# Patient Record
Sex: Male | Born: 1950 | Race: White | Hispanic: No | Marital: Married | Smoking: Never smoker
Health system: Southern US, Community
[De-identification: ages and names within clinical notes are randomized; demographics above are authoritative.]

## PROBLEM LIST (undated history)

## (undated) DIAGNOSIS — J45909 Unspecified asthma, uncomplicated: Secondary | ICD-10-CM

## (undated) DIAGNOSIS — G629 Polyneuropathy, unspecified: Secondary | ICD-10-CM

## (undated) DIAGNOSIS — K635 Polyp of colon: Secondary | ICD-10-CM

## (undated) HISTORY — PX: TONSILLECTOMY AND ADENOIDECTOMY: SUR1326

## (undated) HISTORY — DX: Unspecified asthma, uncomplicated: J45.909

## (undated) HISTORY — DX: Polyneuropathy, unspecified: G62.9

## (undated) HISTORY — DX: Polyp of colon: K63.5

---

## 2003-04-01 HISTORY — PX: OTHER SURGICAL HISTORY: SHX169

## 2003-10-21 ENCOUNTER — Emergency Department (HOSPITAL_COMMUNITY): Admission: EM | Admit: 2003-10-21 | Discharge: 2003-10-21 | Payer: Self-pay | Admitting: Emergency Medicine

## 2004-03-31 HISTORY — PX: OTHER SURGICAL HISTORY: SHX169

## 2006-03-31 DIAGNOSIS — K635 Polyp of colon: Secondary | ICD-10-CM

## 2006-03-31 HISTORY — DX: Polyp of colon: K63.5

## 2006-03-31 HISTORY — PX: OTHER SURGICAL HISTORY: SHX169

## 2006-04-14 ENCOUNTER — Ambulatory Visit: Payer: Self-pay | Admitting: Internal Medicine

## 2006-04-14 LAB — CONVERTED CEMR LAB
ALT: 26 units/L (ref 0–40)
AST: 27 units/L (ref 0–37)
Albumin: 4.4 g/dL (ref 3.5–5.2)
Alkaline Phosphatase: 60 units/L (ref 39–117)
BUN: 13 mg/dL (ref 6–23)
Basophils Absolute: 0 10*3/uL (ref 0.0–0.1)
Basophils Relative: 0.7 % (ref 0.0–1.0)
CO2: 29 meq/L (ref 19–32)
Calcium: 9.6 mg/dL (ref 8.4–10.5)
Chloride: 103 meq/L (ref 96–112)
Cholesterol: 225 mg/dL (ref 0–200)
Creatinine, Ser: 1.1 mg/dL (ref 0.4–1.5)
Direct LDL: 154.9 mg/dL
Eosinophils Relative: 1.4 % (ref 0.0–5.0)
Free T4: 0.8 ng/dL (ref 0.6–1.6)
GFR calc Af Amer: 89 mL/min
GFR calc non Af Amer: 74 mL/min
Glucose, Bld: 92 mg/dL (ref 70–99)
HCT: 45.1 % (ref 39.0–52.0)
HDL: 53.6 mg/dL (ref >39.0)
Hemoglobin: 15.2 g/dL (ref 13.0–17.0)
Lymphocytes Relative: 18 % (ref 12.0–46.0)
MCHC: 33.6 g/dL (ref 30.0–36.0)
MCV: 88.5 fL (ref 78.0–100.0)
Monocytes Absolute: 0.6 10*3/uL (ref 0.2–0.7)
Monocytes Relative: 9.2 % (ref 3.0–11.0)
Neutro Abs: 5 10*3/uL (ref 1.4–7.7)
Neutrophils Relative %: 70.7 % (ref 43.0–77.0)
PSA: 0.53 ng/mL (ref 0.10–4.00)
Platelets: 288 10*3/uL (ref 150–400)
Potassium: 4.7 meq/L (ref 3.5–5.1)
RBC: 5.09 M/uL (ref 4.22–5.81)
RDW: 12.7 % (ref 11.5–14.6)
Sodium: 140 meq/L (ref 135–145)
T3, Free: 2.9 pg/mL (ref 2.3–4.2)
TSH: 4.02 microintl units/mL (ref 0.35–5.50)
Total Bilirubin: 1 mg/dL (ref 0.3–1.2)
Total CHOL/HDL Ratio: 4.2
Total Protein: 6.6 g/dL (ref 6.0–8.3)
Triglycerides: 67 mg/dL (ref 0–149)
VLDL: 13 mg/dL (ref 0–40)
WBC: 7 10*3/uL (ref 4.5–10.5)

## 2006-04-27 ENCOUNTER — Ambulatory Visit: Payer: Self-pay

## 2006-05-02 DIAGNOSIS — D126 Benign neoplasm of colon, unspecified: Secondary | ICD-10-CM | POA: Insufficient documentation

## 2006-05-04 ENCOUNTER — Ambulatory Visit: Payer: Self-pay | Admitting: Gastroenterology

## 2006-05-18 ENCOUNTER — Ambulatory Visit: Payer: Self-pay | Admitting: Gastroenterology

## 2006-05-18 ENCOUNTER — Encounter (INDEPENDENT_AMBULATORY_CARE_PROVIDER_SITE_OTHER): Payer: Self-pay | Admitting: Specialist

## 2006-08-25 ENCOUNTER — Ambulatory Visit: Payer: Self-pay | Admitting: Gastroenterology

## 2006-08-25 LAB — CONVERTED CEMR LAB
ALT: 20 units/L (ref 0–40)
AST: 23 units/L (ref 0–37)
Albumin: 4.2 g/dL (ref 3.5–5.2)
Alkaline Phosphatase: 53 units/L (ref 39–117)
BUN: 14 mg/dL (ref 6–23)
Bilirubin, Direct: 0.1 mg/dL (ref 0.0–0.3)
CO2: 32 meq/L (ref 19–32)
Calcium: 9.4 mg/dL (ref 8.4–10.5)
Chloride: 106 meq/L (ref 96–112)
Creatinine, Ser: 0.9 mg/dL (ref 0.4–1.5)
GFR calc Af Amer: 113 mL/min
GFR calc non Af Amer: 93 mL/min
Glucose, Bld: 81 mg/dL (ref 70–99)
HCT: 45.3 % (ref 39.0–52.0)
Potassium: 4.4 meq/L (ref 3.5–5.1)
Sodium: 142 meq/L (ref 135–145)
TSH: 4.3 microintl units/mL (ref 0.35–5.50)
Tissue Transglutaminase Ab, IgA: <3 units (ref <5)
Total Bilirubin: 0.8 mg/dL (ref 0.3–1.2)
Total Protein: 6.6 g/dL (ref 6.0–8.3)

## 2006-08-26 ENCOUNTER — Encounter: Payer: Self-pay | Admitting: Gastroenterology

## 2006-12-29 ENCOUNTER — Ambulatory Visit: Payer: Self-pay | Admitting: Internal Medicine

## 2007-04-16 ENCOUNTER — Ambulatory Visit: Payer: Self-pay | Admitting: Internal Medicine

## 2007-06-11 ENCOUNTER — Telehealth: Payer: Self-pay | Admitting: Internal Medicine

## 2007-06-12 ENCOUNTER — Ambulatory Visit: Payer: Self-pay | Admitting: Family Medicine

## 2007-06-14 ENCOUNTER — Telehealth (INDEPENDENT_AMBULATORY_CARE_PROVIDER_SITE_OTHER): Payer: Self-pay | Admitting: *Deleted

## 2007-08-31 ENCOUNTER — Ambulatory Visit: Payer: Self-pay | Admitting: Internal Medicine

## 2007-08-31 DIAGNOSIS — E785 Hyperlipidemia, unspecified: Secondary | ICD-10-CM | POA: Insufficient documentation

## 2007-08-31 DIAGNOSIS — J45909 Unspecified asthma, uncomplicated: Secondary | ICD-10-CM | POA: Insufficient documentation

## 2007-08-31 DIAGNOSIS — G609 Hereditary and idiopathic neuropathy, unspecified: Secondary | ICD-10-CM | POA: Insufficient documentation

## 2007-09-02 ENCOUNTER — Encounter (INDEPENDENT_AMBULATORY_CARE_PROVIDER_SITE_OTHER): Payer: Self-pay | Admitting: *Deleted

## 2008-07-18 ENCOUNTER — Ambulatory Visit: Payer: Self-pay | Admitting: Internal Medicine

## 2008-07-18 ENCOUNTER — Encounter: Payer: Self-pay | Admitting: Internal Medicine

## 2008-07-18 DIAGNOSIS — R209 Unspecified disturbances of skin sensation: Secondary | ICD-10-CM | POA: Insufficient documentation

## 2008-07-18 LAB — CONVERTED CEMR LAB
Cholesterol, target level: 200 mg/dL
Cholesterol: 235 mg/dL — ABNORMAL HIGH (ref 0–200)
Direct LDL: 155.2 mg/dL
Folate: >20 ng/mL
Free T4: 0.8 ng/dL (ref 0.6–1.6)
HDL goal, serum: 40 mg/dL
HDL: 56.2 mg/dL (ref >39.00)
LDL Goal: 120 mg/dL
TSH: 3.25 microintl units/mL (ref 0.35–5.50)
Total CHOL/HDL Ratio: 4
Triglycerides: 62 mg/dL (ref 0.0–149.0)
VLDL: 12.4 mg/dL (ref 0.0–40.0)
Vitamin B-12: 499 pg/mL (ref 211–911)

## 2008-07-20 ENCOUNTER — Telehealth (INDEPENDENT_AMBULATORY_CARE_PROVIDER_SITE_OTHER): Payer: Self-pay | Admitting: *Deleted

## 2008-07-20 ENCOUNTER — Encounter (INDEPENDENT_AMBULATORY_CARE_PROVIDER_SITE_OTHER): Payer: Self-pay | Admitting: *Deleted

## 2008-09-07 ENCOUNTER — Ambulatory Visit: Payer: Self-pay | Admitting: Internal Medicine

## 2008-09-10 LAB — CONVERTED CEMR LAB
ALT: 22 units/L (ref 0–53)
AST: 28 units/L (ref 0–37)
Albumin: 4.1 g/dL (ref 3.5–5.2)
Alkaline Phosphatase: 60 units/L (ref 39–117)
Bilirubin, Direct: 0 mg/dL (ref 0.0–0.3)
Cholesterol: 216 mg/dL — ABNORMAL HIGH (ref 0–200)
Direct LDL: 154.8 mg/dL
HDL: 56.2 mg/dL (ref >39.00)
Total Bilirubin: 0.8 mg/dL (ref 0.3–1.2)
Total CHOL/HDL Ratio: 4
Total Protein: 6.8 g/dL (ref 6.0–8.3)
Triglycerides: 50 mg/dL (ref 0.0–149.0)
VLDL: 10 mg/dL (ref 0.0–40.0)

## 2008-09-11 ENCOUNTER — Encounter (INDEPENDENT_AMBULATORY_CARE_PROVIDER_SITE_OTHER): Payer: Self-pay | Admitting: *Deleted

## 2008-09-11 ENCOUNTER — Telehealth (INDEPENDENT_AMBULATORY_CARE_PROVIDER_SITE_OTHER): Payer: Self-pay | Admitting: *Deleted

## 2008-11-17 ENCOUNTER — Telehealth (INDEPENDENT_AMBULATORY_CARE_PROVIDER_SITE_OTHER): Payer: Self-pay | Admitting: *Deleted

## 2009-05-25 ENCOUNTER — Ambulatory Visit: Payer: Self-pay | Admitting: Family

## 2009-12-28 IMAGING — CR DG CHEST 2V
2 series · 2 of 2 positions shown · non-contrast
Comparison: None available.

CLINICAL DATA: Asthma.  Cough.

CHEST - 2 VIEW

[view not recorded (1 of 2)]
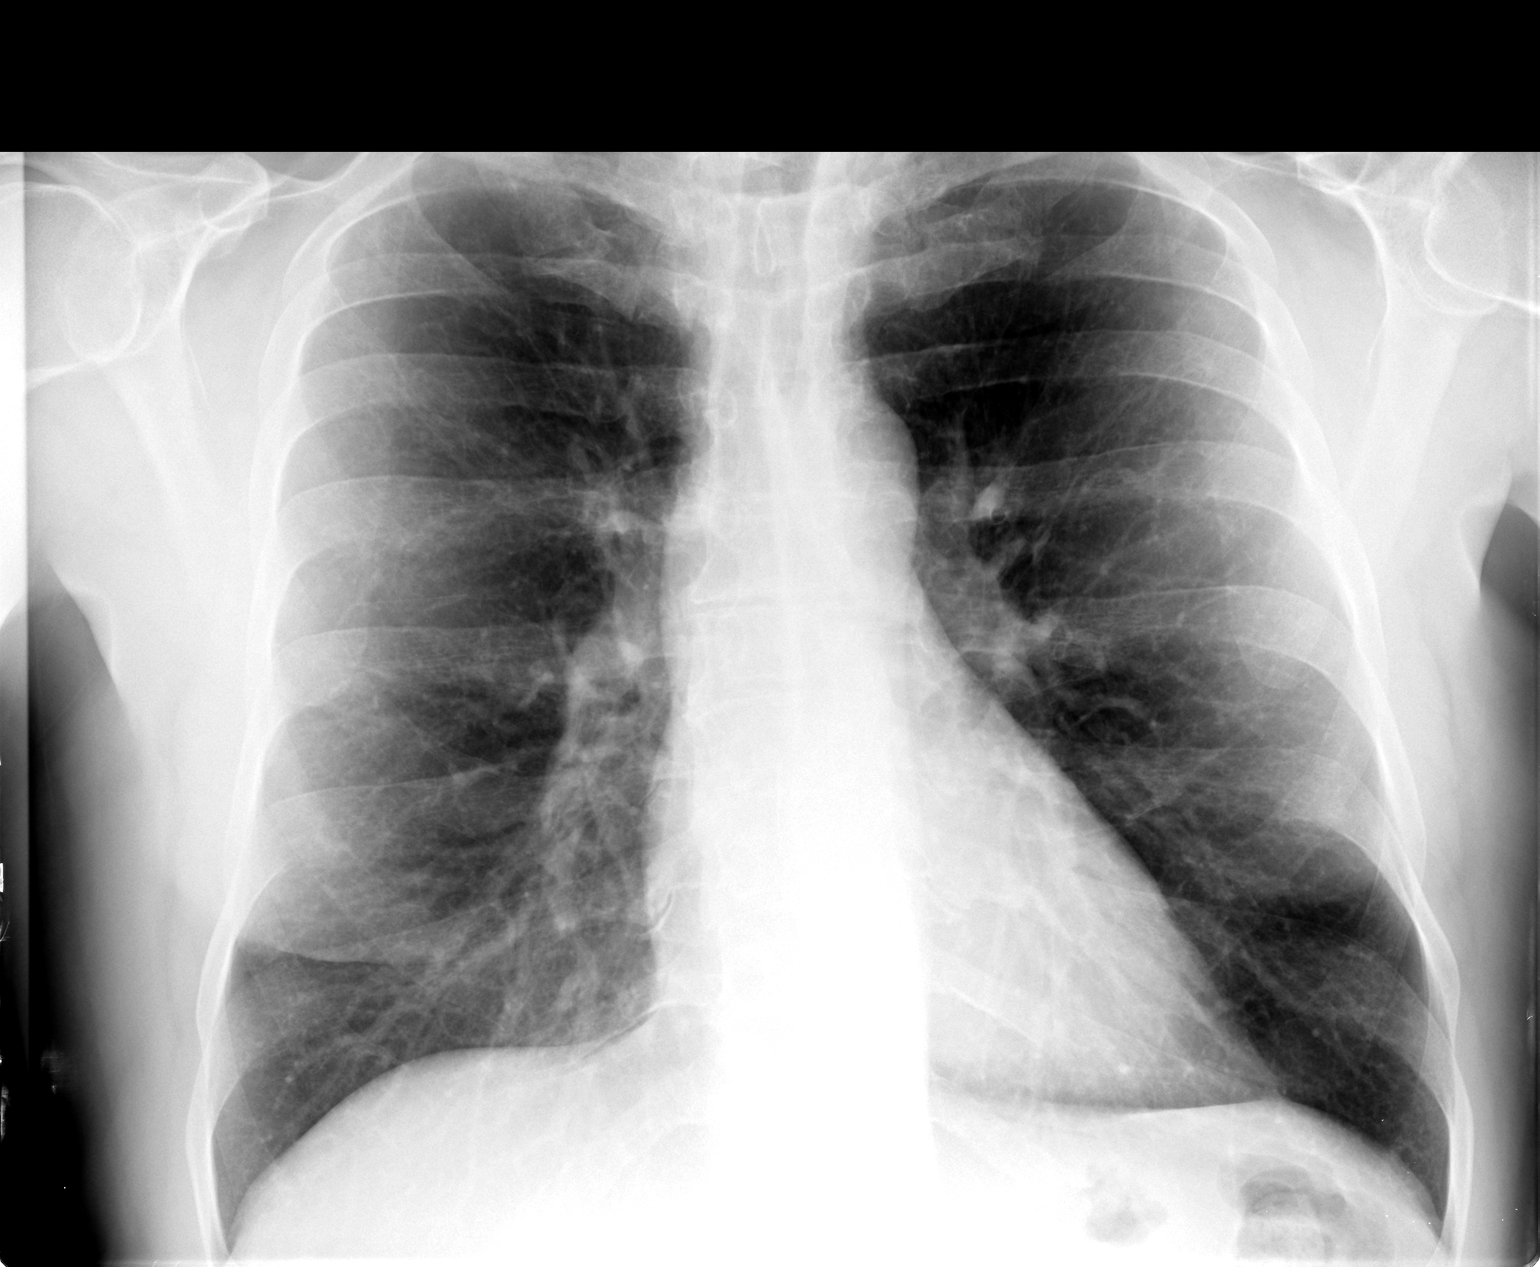

[view not recorded (2 of 2)]
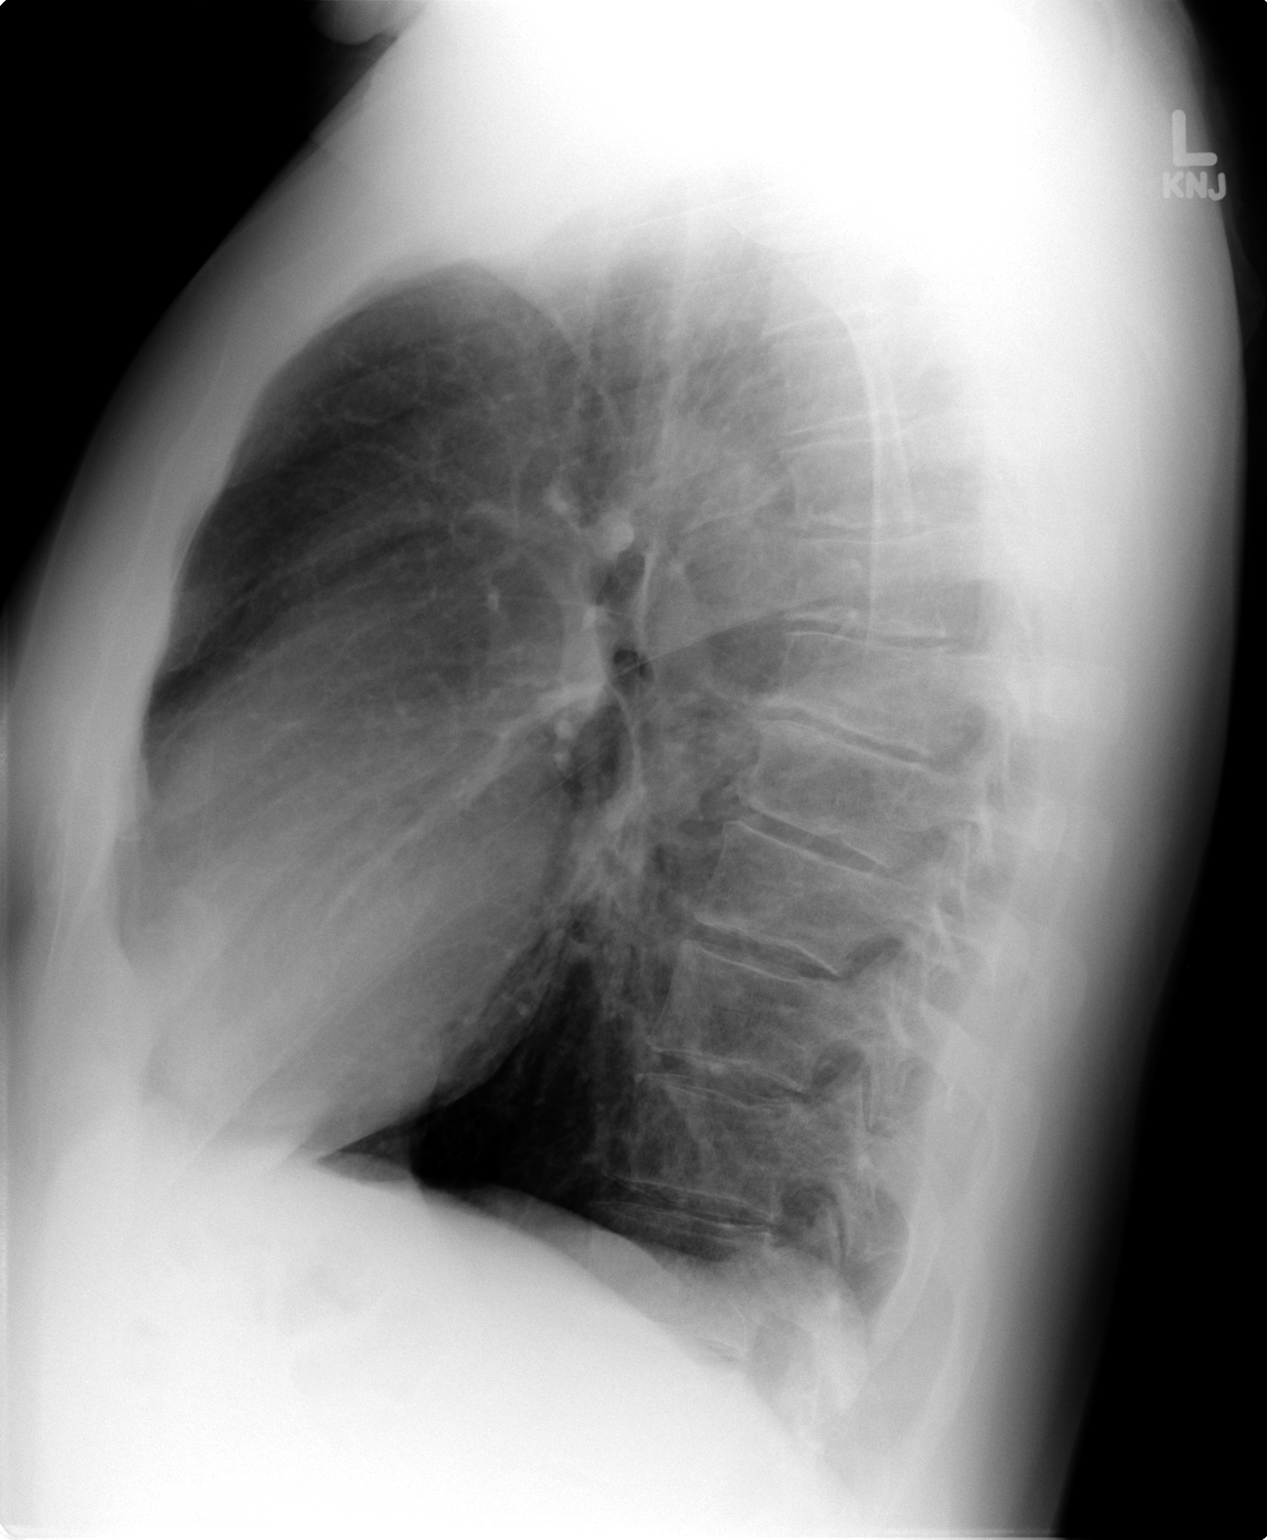

[2 of 2 positions shown; findings below may reference images not displayed]

FINDINGS: The lungs are clear.  There is no pleural effusion.
Heart size is normal.  No focal bony abnormality.
IMPRESSION: No acute disease.

## 2010-01-17 ENCOUNTER — Encounter: Payer: Self-pay | Admitting: Family Medicine

## 2010-03-19 ENCOUNTER — Ambulatory Visit: Payer: Self-pay | Admitting: Sports Medicine

## 2010-03-19 DIAGNOSIS — Q667 Congenital pes cavus, unspecified foot: Secondary | ICD-10-CM | POA: Insufficient documentation

## 2010-03-19 DIAGNOSIS — M5137 Other intervertebral disc degeneration, lumbosacral region: Secondary | ICD-10-CM | POA: Insufficient documentation

## 2010-04-09 ENCOUNTER — Encounter: Payer: Self-pay | Admitting: Internal Medicine

## 2010-04-09 ENCOUNTER — Ambulatory Visit
Admission: RE | Admit: 2010-04-09 | Discharge: 2010-04-09 | Payer: Self-pay | Source: Home / Self Care | Attending: Internal Medicine | Admitting: Internal Medicine

## 2010-04-09 DIAGNOSIS — J019 Acute sinusitis, unspecified: Secondary | ICD-10-CM | POA: Insufficient documentation

## 2010-04-18 ENCOUNTER — Ambulatory Visit
Admission: RE | Admit: 2010-04-18 | Discharge: 2010-04-18 | Payer: Self-pay | Source: Home / Self Care | Attending: Sports Medicine | Admitting: Sports Medicine

## 2010-04-18 ENCOUNTER — Encounter: Payer: Self-pay | Admitting: Sports Medicine

## 2010-04-18 DIAGNOSIS — M79609 Pain in unspecified limb: Secondary | ICD-10-CM | POA: Insufficient documentation

## 2010-04-18 DIAGNOSIS — M7750 Other enthesopathy of unspecified foot: Secondary | ICD-10-CM | POA: Insufficient documentation

## 2010-04-26 ENCOUNTER — Telehealth (INDEPENDENT_AMBULATORY_CARE_PROVIDER_SITE_OTHER): Payer: Self-pay | Admitting: *Deleted

## 2010-04-28 LAB — CONVERTED CEMR LAB
Cholesterol, target level: 200 mg/dL
HDL goal, serum: 40 mg/dL
LDL Goal: 160 mg/dL
PSA: 0.55 ng/mL (ref 0.10–4.00)

## 2010-05-01 NOTE — Progress Notes (Signed)
Summary: Lab/Chest X-Ray Results  Phone Note Outgoing Call Call back at Home Phone [phone]   Call placed by: Shonna Chock,  July 20, 2008 10:19 AM Call placed to: Patient Details for Reason: lab/chest xray results Summary of Call: Spoke with patient re: labs and chest x-ray results. Patient ok'd all information/copies of both reports to be mailed. Scheduled appointment to recheck labs in 08/2008 (appointment card to be mailed).  Chrae Malloy  July 20, 2008 10:27 AM

## 2010-05-01 NOTE — Assessment & Plan Note (Signed)
Summary: sinus infection//fd   Vital Signs:  Patient profile:   60 year old male Height:      75 inches Weight:      208.25 pounds BMI:     26.12 Temp:     98.5 degrees F oral Pulse rate:   76 / minute Pulse rhythm:   regular Resp:     16 per minute BP sitting:   140 / 98  (left arm) Cuff size:   regular  Vitals Entered By: Mervin Kung CMA (May 25, 2009 11:37 AM) CC: room 14  cold symptoms Comments sinus drainage, runny nose, productive cough with intermittent feve since Saturday.   CC:  room 14  cold symptoms.  History of Present Illness: Charles Hopkins is a 60 year old male who presents with c/o sinus pressure/nasal congestion which started on Saturday.  Nasal discharge is "greenish" and copious.  He has some associated cough as well which started on wednesday.  "barking" in nature.  Became productive after he started mucinex.  Wednesday developed fever.  Yesterday woke up and was feeling "terrible"  Temp stayed during the day but was better last night.  Allergies: 1)  ! Pcn 2)  ! Sulfa 3)  ! * Latex  Physical Exam  General:  Well-developed,well-nourished,in no acute distress; alert,appropriate and cooperative throughout examination Head:  Normocephalic and atraumatic without obvious abnormalities. No apparent alopecia or balding. Ears:  External ear exam shows no significant lesions or deformities.  Otoscopic examination reveals clear canals, tympanic membranes are intact bilaterally without bulging, retraction, inflammation or discharge. Hearing is grossly normal bilaterally. Neck:  No deformities, masses, or tenderness noted. Lungs:  Normal respiratory effort, chest expands symmetrically. Lungs are clear to auscultation, no crackles or wheezes. Heart:  Normal rate and regular rhythm. S1 and S2 normal without gallop, murmur, click, rub or other extra sounds.   Impression & Recommendations:  Problem # 1:  SINUSITIS (ICD-473.9) Assessment New Patient with penicillin  allergy- plan treatment with levaquin.  Pt instructed to call if you develop fever over 101, increasing sinus pressure, pain with eye movement, increased facial tenderness of swelling, or if visual changes.  His updated medication list for this problem includes:    Levaquin 750 Mg Tabs (Levofloxacin) ..... One tablet by mouth daily x 5 days    Tessalon Perles 100 Mg Caps (Benzonatate) ..... One cap by mouth three times a day as needed cough  Complete Medication List: 1)  Aleve  .Marland Kitchen.. 2 tablets at bedtime 2)  Multivitiamin  3)  Vit C  4)  Levaquin 750 Mg Tabs (Levofloxacin) .... One tablet by mouth daily x 5 days 5)  Tessalon Perles 100 Mg Caps (Benzonatate) .... One cap by mouth three times a day as needed cough  Patient Instructions: 1)   Call if you develop fever over 101, increasing sinus pressure, pain with eye movement, increased facial tenderness of swelling, or if you develop visual changes. Prescriptions: TESSALON PERLES 100 MG CAPS (BENZONATATE) one cap by mouth three times a day as needed cough  #30 x 0   Entered and Authorized by:   Lemont Fillers FNP   Signed by:   Lemont Fillers FNP on 05/25/2009   Method used:   Print then Give to Patient   RxID:   [id number] LEVAQUIN 750 MG TABS (LEVOFLOXACIN) one tablet by mouth daily x 5 days  #5 x 0   Entered and Authorized by:   Lemont Fillers FNP   Signed  by:   Lemont Fillers FNP on 05/25/2009   Method used:   Print then Give to Patient   RxID:   [id number]   Current Allergies (reviewed today): ! PCN ! SULFA ! * LATEX

## 2010-05-01 NOTE — Letter (Signed)
Summary: [facility]  [facility]   Imported By: Marily Memos 02/28/2010 16:12:02  _____________________________________________________________________  External Attachment:    Type:   Image     Comment:   External Document

## 2010-05-01 NOTE — Letter (Signed)
Summary: Results Follow up Letter  [facility]  [address] [place], [place] [address]   Phone: [phone]  Fax: [phone]    07/20/2008 MRN: [id number]  Charles Hopkins [address] [place], [place]  [address]  Dear Mr. Impastato,  The following are the results of your recent test(s):  Test         Result    Pap Smear:        Normal _____  Not Normal _____ Comments: ______________________________________________________ Cholesterol: LDL(Bad cholesterol):         Your goal is less than:         HDL (Good cholesterol):       Your goal is more than: Comments:  ______________________________________________________ Mammogram:        Normal _____  Not Normal _____ Comments:  ___________________________________________________________________ Hemoccult:        Normal _____  Not normal _______ Comments:    _____________________________________________________________________ Other Tests:Please see attached labs and Chest X-ray reports done on 07/18/2008    We routinely do not discuss normal results over the telephone.  If you desire a copy of the results, or you have any questions about this information we can discuss them at your next office visit.   Sincerely,

## 2010-05-01 NOTE — Progress Notes (Signed)
Summary: sick/HOP SEE THIS PLEASE  Phone Note Call from Patient   Caller: Patient Reason for Call: Acute Illness Summary of Call: dr. Alwyn Ren [facility] pharmacy [phone] patient is [place] mucus and cough. he wanted to know if he could get something called into the pharmacy for him. i offered an office visit, patient refused.  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 A recurrent sinusitis needs to be evaluated ; CT sinus scan may be needed &/or other triggers need to be eliminated (Ex allergy, anatomic factors,etc). Levaquin has very high risk for causing C. difficle colitis which can be health or life threatening & should not be Rxed w/o documented need. I encourage him to seek 2nd opinion ; but I can not take risks with his health. Additional Follow-up by: Marga Melnick MD,  June 12, 2007 3:49 PM

## 2010-05-01 NOTE — Miscellaneous (Signed)
Summary: Waiver of Liability  Waiver of Liability   Imported By: Freddy Jaksch 08/31/2007 14:50:11  _____________________________________________________________________  External Attachment:    Type:   Image     Comment:   External Document

## 2010-05-01 NOTE — Letter (Signed)
Summary: Results Follow up Letter  [facility]  [address] [place], [place] [address]   Phone: [phone]  Fax: [phone]    09/11/2008 MRN: [id number]  Charles Hopkins [address] [place], [place]  [address]  Dear [name],  The following are the results of your recent test(s):  Test         Result    Pap Smear:        Normal _____  Not Normal _____ Comments: ______________________________________________________ Cholesterol: LDL(Bad cholesterol):         Your goal is less than:         HDL (Good cholesterol):       Your goal is more than: Comments:  ______________________________________________________ Mammogram:        Normal _____  Not Normal _____ Comments:  ___________________________________________________________________ Hemoccult:        Normal _____  Not normal _______ Comments:    _____________________________________________________________________ Other Tests: PLEASE SEE ATTACHED LABS DONE ON 09/07/2008 AND APPOINTMENT CARD TO RECHECK LABS IN 10 WEEKS    We routinely do not discuss normal results over the telephone.  If you desire a copy of the results, or you have any questions about this information we can discuss them at your next office visit.   Sincerely,

## 2010-05-01 NOTE — Assessment & Plan Note (Signed)
Summary: roa & lab/cbs   Vital Signs:  Patient Profile:   60 Years Old Male Weight:      220 pounds Pulse rate:   70 / minute Resp:     14 per minute BP sitting:   128 / 80  (left arm)  Pt. in pain?   no  Vitals Entered By: Doristine Devoid (August 31, 2007 9:05 AM)                  Chief Complaint:  follow up and labs.  History of Present Illness: Last LDL in 1/08 was 155; on heart healthy diet.CVE as jogging on treadmill 3-4x/week for 25 min w/o symptoms.  Lipid Management History:      Positive NCEP/ATP III risk factors include male age 38 years old or older and family history for ischemic heart disease (males less than 10 years old).  Negative NCEP/ATP III risk factors include non-diabetic, non-tobacco-user status, non-hypertensive, no ASHD (atherosclerotic heart disease), no prior stroke/TIA, no peripheral vascular disease, and no history of aortic aneurysm.       Current Allergies: ! PCN ! SULFA ! 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 Neuralgia/ spinal meningitis post surgery    Asthma as child  Past Surgical History:    CNS surgery    Tonsillectomy    Colon polypectomy 2008, repeat 2012   Family History:    Father: COAD    Mother: breast CA    Siblings: bro 4 vessel CBAG @ 25 & endarterectomy; sister breast CA    uncles & aunt CAD   Risk Factors:  Tobacco use:  never Alcohol use:  yes    Type:  occa Exercise:  yes    Type:  see HPI  Family History Risk Factors:    Family History of MI in females < 31 years old:  no    Family History of MI in males < 51 years old:  yes   Review of Systems  General      Denies fatigue and weight loss.  Eyes      Complains of double vision.      Since cns surgery  CV      Denies chest pain or discomfort, leg cramps with exertion, and shortness of breath with exertion.  Resp      Denies shortness of breath.      asthma quiescent w/o meds  GI  Denies abdominal pain, bloody stools, dark tarry stools, and indigestion.  GU      Denies decreased libido and erectile dysfunction.  MS      Complains of cramps.      nocturnal leg cramps  better with banana  Neuro      Complains of numbness.      Denies tingling.  Endo      ? prominent breasts since cns surgery   Physical Exam  General:     Well-developed,well-nourished,in no acute distress; alert,appropriate and cooperative throughout examination Neck:     No deformities, masses, or tenderness noted. Breasts:     No masses or gynecomastia noted Lungs:     Normal respiratory effort, chest expands symmetrically. Lungs are clear to auscultation, no crackles or wheezes. Heart:     Normal rate and regular rhythm. S1 and S2 normal without gallop, murmur, click, rub or other extra sounds. Abdomen:     Bowel sounds positive,abdomen soft and non-tender without masses, organomegaly or hernias  noted. Rectal:     stool cards Pulses:     R and L carotid,radial,dorsalis pedis and posterior tibial pulses are full and equal bilaterally Neurologic:     alert & oriented X3 and DTRs symmetrical and normal.   Skin:     Intact without suspicious lesions or rashes. Scarring R shin Cervical Nodes:     No lymphadenopathy noted Axillary Nodes:     No palpable lymphadenopathy Psych:     memory intact for recent and remote, normally interactive, and good eye contact.  Motivated & focused    Impression & Recommendations:  Problem # 1:  HYPERLIPIDEMIA (ICD-272.4)  Orders: T- * Misc. Laboratory test [id number])   Problem # 2:  POLYP, COLON (ICD-211.3)  Problem # 3:  ASTHMA (ICD-493.90) Resolved  Problem # 4:  PERIPHERAL NEUROPATHY (ICD-356.9) as per Dr Angelyn Punt  Problem # 5:  CORONARY ARTERY DISEASE, FAMILY HX (ICD-V17.3)  Complete Medication List: 1)  Aleve 1 Po Qd  2)  Multivitiamin  3)  Vit C   Other Orders: TLB-PSA (Prostate Specific Antigen) (84153-PSA)  Lipid  Assessment/Plan:      Based on NCEP/ATP III, the patient's risk factor category is "2 or more risk factors and a calculated 10 year CAD risk of < 20%".  From this information, the patient's calculated lipid goals are as follows: Total cholesterol goal is 200; LDL cholesterol goal is 130; HDL cholesterol goal is 40; Triglyceride goal is 150.  His LDL cholesterol goal has not been met.  Secondary causes for hyperlipidemia have been ruled out.  He has been counseled on adjunctive measures for lowering his cholesterol and has been provided with dietary instructions.     Patient Instructions: 1)  Review LDL article . Diet references : mypyramidtracker.gov & Dr Gildardo Griffes Eat, Drink, & Be Healthy. Complete stool cards   ]

## 2010-05-01 NOTE — Assessment & Plan Note (Signed)
Summary: SINUS INFECTION/SCM   Vital Signs:  Patient Profile:   60 Years Old Male Weight:      219 pounds Temp:     97.1 degrees F oral Pulse rate:   60 / minute BP sitting:   114 / 66  (left arm) Cuff size:   large  Vitals Entered By: Shonna Chock (June 12, 2007 12:36 PM)                 Chief Complaint:  SINUS INFECTION.  Acute Visit History:      The patient complains of cough, nasal discharge, and sinus problems.  These symptoms began 2 days ago.  Other comments include: 1/09 sinus infection, biaxin, resolved 100 % using netty pot PND .        The character of the cough is described as nonproductive.  There is no history of wheezing or shortness of breath associated with his cough.        He complains of sinus pressure, teeth aching, nasal congestion, purulent drainage, and epistaxis.          Current Allergies (reviewed today): ! PCN ! SULFA ! * LATEX     Review of Systems      See HPI   Physical Exam  General:     Well-developed,well-nourished,in no acute distress; alert,appropriate and cooperative throughout examination Head:     no max sinus tenderness Ears:     External ear exam shows no significant lesions or deformities.  Otoscopic examination reveals clear canals, tympanic membranes are intact bilaterally without bulging, retraction, inflammation or discharge. Hearing is grossly normal bilaterally. Nose:     nasal dischargemucosal pallor.   Mouth:     MMM, no oropharyngeal erythema Lungs:     Normal respiratory effort, chest expands symmetrically. Lungs are clear to auscultation, no crackles or wheezes. Heart:     Normal rate and regular rhythm. S1 and S2 normal without gallop, murmur, click, rub or other extra sounds. Cervical Nodes:     No lymphadenopathy noted    Impression & Recommendations:  Problem # 1:  SINUSITIS- ACUTE-NOS (ICD-461.9) Likely due to virus and allergies. Try treatment with antihistamine along with decongestant, if  not better may fill antibiotics. The following medications were removed from the medication list:    Zithromax 250 Mg Tabs (Azithromycin) .Marland Kitchen... As directed    Clarithromycin 500 Mg Tb24 (Clarithromycin) .Marland Kitchen... 2 once daily with a meal  His updated medication list for this problem includes:    Biaxin 500 Mg Tabs (Clarithromycin) .Marland Kitchen... 1 tab by mouth two times a day x 10 days   Complete Medication List: 1)  Aleve 1 Po Qd  2)  Multivitiamin  3)  Vit C  4)  Biaxin 500 Mg Tabs (Clarithromycin) .Marland Kitchen.. 1 tab by mouth two times a day x 10 days   Patient Instructions: 1)  mucinex D and claritin OTC 2)  if symptoms not improving in 3-4 days, may fill antibiotic     Prescriptions: BIAXIN 500 MG  TABS (CLARITHROMYCIN) 1 tab by mouth two times a day x 10 days  #20 x 0   Entered and Authorized by:   Kerby Nora MD   Signed by:   Kerby Nora MD on 06/12/2007   Method used:   Print then Give to Patient   RxID:   [id number]  ]

## 2010-05-01 NOTE — Progress Notes (Signed)
Summary: phone  Phone Note Call from Patient   Caller: Patient Action Taken: Patient advised to call [phone] Summary of Call: Patient is requesting a phone call back from the nurse. Patient stated he didnt receive a call from the message he left in June this was concerning the med  prevastatin. Contact [phone]  Initial call taken by: Barb Merino,  November 17, 2008 4:05 PM  Follow-up for Phone Call        SPOKE WITH PATIENT, PATIENT SAID THE MESSAGE MUST OF BEEN ERASED. HE DOESNT REMEMBER THE CONVERSATION FULLY. PATIENT WAS INFORMED AGAIN HE NEEDS APPOINTMENT. PATIENT WILL CALL WHEN HE GETS BACK IN TOWN TO [place]HEUDLE F/U Follow-up by: Shonna Chock,  November 17, 2008 4:25 PM

## 2010-05-01 NOTE — Miscellaneous (Signed)
Summary: Orders Update  Clinical Lists Changes  Orders: Added new Test order of T-2 View CXR ([id number]) - Signed 

## 2010-05-01 NOTE — Letter (Signed)
Summary: Results Follow up Letter  [name]  [address] [place], [place] [address]   Phone: [phone]  Fax: [phone]    09/02/2007 MRN: [id number]  Charles Hopkins [address] [place], [place]  [address]  Dear Mr. Mennenga,  The following are the results of your recent test(s):  Test         Result    Pap Smear:        Normal _____  Not Normal _____ Comments: ______________________________________________________ Cholesterol: LDL(Bad cholesterol):         Your goal is less than:         HDL (Good cholesterol):       Your goal is more than: Comments:  ______________________________________________________ Mammogram:        Normal _____  Not Normal _____ Comments:  ___________________________________________________________________ Hemoccult:        Normal _____  Not normal _______ Comments:    _____________________________________________________________________ Other Tests:  PSA excellent recheck in 1 year. Cholesterol panel pending.  We routinely do not discuss normal results over the telephone.  If you desire a copy of the results, or you have any questions about this information we can discuss them at your next office visit.   Sincerely,

## 2010-05-01 NOTE — Assessment & Plan Note (Signed)
Summary: [email or web address].cbs   Vital Signs:  Patient Profile:   60 Years Old Male Weight:      212.50 pounds Temp:     98.8 degrees F oral Pulse rate:   76 / minute Pulse rhythm:   regular BP sitting:   128 / 62  (left arm) Cuff size:   large  Pt. in pain?   no  Vitals Entered By: Wendall Stade (December 29, 2006 2:50 PM)                  Chief Complaint:  sick for 8 days.  History of Present Illness: Onset as hoarseness last Tues 923/08; head congestion next day ,Rx: Pseudovent. Chest congestion 9/27. Some asthnma type sx as of last nite with SOB. PMH asthma; last 1972.  Current Allergies (reviewed today): ! PCN ! SULFA ! * LATEX Updated/Current Medications (including changes made in today's visit):  * ALEVE 1 PO QD  * MULTIVITIAMIN  * VIT C  ZITHROMAX 250 MG  TABS (AZITHROMYCIN) as directed      Review of Systems  General      Complains of sweats.      Denies chills and fever.  Eyes      Denies discharge, eye pain, and red eye.  ENT      See HPI      Denies earache, nasal congestion, postnasal drainage, sinus pressure, and sore throat.      no facial pain, no anosmia  Resp      See HPI      Complains of cough, sputum productive, and wheezing.      Denies chest pain with inspiration and pleuritic.      2 tbsp once daily of [place]  "clumps"   Physical Exam  General:     Well-developed,well-nourished,in no acute distress; alert,appropriate and cooperative throughout examination Eyes:     pupils equal, pupils round, pupils reactive to light, pupils react to accomodation, and no injection.   Full EOM Ears:     Slight scarring Nose:     Septum to R, septal dislocation Mouth:     Hyponasal speech Lungs:     Normal respiratory effort, chest expands symmetrically. Lungs are clear to auscultation, no crackles or wheezes. Cervical Nodes:     No lymphadenopathy noted Axillary Nodes:     No palpable lymphadenopathy    Impression & Recommendations:  Problem # 1:  BRONCHITIS-ACUTE (ICD-466.0)  His updated medication list for this problem includes:    Zithromax 250 Mg Tabs (Azithromycin) .Marland Kitchen... As directed   Complete Medication List: 1)  Aleve 1 Po Qd  2)  Multivitiamin  3)  Vit C  4)  Zithromax 250 Mg Tabs (Azithromycin) .... As directed   Patient Instructions: 1)  Drink as much fluid as you can tolerate for the next few days. Neti pot once daily as needed nasal congestion. Advair 1 inhalation every 12 hrs.    Prescriptions: ZITHROMAX 250 MG  TABS (AZITHROMYCIN) as directed  #1 x 0   Entered and Authorized by:   Marga Melnick MD   Signed by:   Marga Melnick MD on 12/29/2006   Method used:   Print then Give to Patient   RxID:   [id number]  ]

## 2010-05-01 NOTE — Progress Notes (Signed)
Summary: CALLED PT L/M  Phone Note Outgoing Call   Call placed by: Daine Gip,  June 14, 2007 1:37 PM Call placed to: [phone] Details for Reason: CALLED PT AT HOME # L/M Summary of Call: PER DR HOPPER NOT GOOD STANDARD OF CARE TO TREAT PT W/O BEING SEEN...CALLED PT LEFT MESSAGE TO DISCUSS//CYD Initial call taken by: Daine Gip,  June 14, 2007 1:38 PM  Follow-up for Phone Call        PT RETURNED CALL...SAYS HE UNDERSTOOD STANDARD OF CARE.Marland KitchenPT WENT TO SAT CLINIC.Marland KitchenSAYS HE IS DOING BETTER....................................................................Marland KitchenDaine Gip  June 14, 2007 5:02 PM Follow-up by: Daine Gip,  June 14, 2007 5:02 PM

## 2010-05-02 NOTE — Assessment & Plan Note (Signed)
Summary: orthotics,mc   Vital Signs:  Patient profile:   60 year old male Height:      75 inches Weight:      214 pounds BMI:     26.84 Pulse rate:   76 / minute BP sitting:   137 / 76  (left arm)  Vitals Entered By: Rochele Pages RN (April 18, 2010 11:26 AM) CC: cont L heel pain, toe stinging 4-5th toe rt foot   CC:  cont L heel pain and toe stinging 4-5th toe rt foot.  History of Present Illness: Pt presents to clinic for follow up of L heel pain and eval for orthotics.  States he has been wearing sports insoles given at last appt in all shoes.   Cont with L heel pain with any weight bearing.   Now having stinging sensation in 4th and 5th toe on rt foot.   Has been walking on treadmill more instead of running due to heel pain.    Noet he has hx of peripheral neuropathy in hands as well as coldness and tingling in feet This started after he developed meningitis following a neurosurgical procedure for [place] he was hospitalized for 28 days and quite sick  Also HX of LBP seen by [facility] in past and has xryas that we reviewed prednisone course did not help w [facility] was very helpful and that plus 2 aleve at night help him get up to 6 hrs sleep wakens daily with hand sxs and sometimes low back  Preventive Screening-Counseling & Management  Alcohol-Tobacco     Smoking Status: never  Allergies: 1)  ! Pcn 2)  ! Sulfa 3)  ! * Latex  Physical Exam  General:  Well-developed,well-nourished,in no acute distress; alert,appropriate and cooperative throughout examination Msk:  RT foot shows collapse of transverse arch more laterally 4th MT head palp thru capsule  hgih arches bilat left heel has a prominent os calcis that is TTP PF not tender   can do leg raise with no probs has avg flexion and ext of back and now weakness on toe or heel walk Additional Exam:  XRAYS there is disk space collapse at 4 levels - L5/S1 L3/4 and two above there is mild ant listhesis of  lower lumbar vertebrae there is ant spurring   Impression & Recommendations:  Problem # 1:  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  The orthotic was heated and the patient stood on the orthotic blank positioned on the orthotic stand. The patient was positioned in subtalar neutral position and 10 degrees of ankle dorsiflexion in a weight bearing stance. After completion of molding a stable based was applied to the orthotic blank.   The blank was ground to a stable position for weight bearing. size 13 base large blue med EVA posting  MT pad RT additional orthotic padding  none  Orders: Orthotic Materials, each unit (L3002)  Problem # 2:  METATARSALGIA (ICD-726.70)  MT pads to orthotics  Orders: Orthotic Materials, each unit (U2725)  Problem # 3:  HEEL PAIN, LEFT (ICD-729.5)  trial on orthotics  Orders: Orthotic Materials, each unit (D6644)  Problem # 4:  DEGENERATIVE DISC DISEASE, LUMBAR SPINE (ICD-722.52) will start some amitriptyline at hs hopefully help back and maybe neuropathiuc sxs  keep up flex exercises watch extension w ant listhesis  Problem # 5:  PERIPHERAL NEUROPATHY (ICD-356.9) this seems to affect hands and I wonder if part of foot issues  Complete Medication List: 1)  Aleve  .Marland Kitchen.. 2 tablets at bedtime 2)  Multivitiamin  3)  Vit C  4)  Clarithromycin 500 Mg Xr24h-tab (Clarithromycin) .... 2 once daily with a meal 5)  Fluticasone Propionate 50 Mcg/act Susp (Fluticasone propionate) .Marland Kitchen.. 1 spray two times a day 6)  Amitriptyline Hcl 25 Mg Tabs (Amitriptyline hcl) .... Take 1 by mouth at bedtime Prescriptions: AMITRIPTYLINE HCL 25 MG TABS (AMITRIPTYLINE HCL) take 1 by mouth at bedtime  #30 x 2   Entered by:   Rochele Pages RN   Authorized by:   Enid Baas MD   Signed by:   Rochele Pages RN on 04/18/2010   Method used:   [facility] to        [facility] (retail)       [address]       [place], [place]  [address]       Ph: [phone] or [phone]       Fax: [phone]   RxID:   [id number]    Orders Added: 1)  Est. Patient Level IV [40347] 2)  Orthotic Materials, each unit [L3002]

## 2010-05-02 NOTE — Consult Note (Signed)
Summary: [facility]  [facility]   Imported By: Marily Memos 04/18/2010 14:50:50  _____________________________________________________________________  External Attachment:    Type:   Image     Comment:   External Document

## 2010-05-02 NOTE — Assessment & Plan Note (Signed)
Summary: SINUS/KN   Vital Signs:  Patient profile:   60 year old male Weight:      214.4 pounds BMI:     26.90 Temp:     98.0 degrees F oral Pulse rate:   72 / minute Resp:     14 per minute BP sitting:   126 / 84  (left arm) Cuff size:   large  Vitals Entered By: Shonna Chock CMA (April 09, 2010 9:20 AM) CC: Sinuses x 1 week , URI symptoms   CC:  Sinuses x 1 week  and URI symptoms.  History of Present Illness:      This is a 60 year old man who presents with URI symptoms; onset 01/03 as nasal congestion.  The patient reports now nasal congestion, purulent nasal discharge, and scratchy  sore throat, but denies productive cough and earache.  Associated symptoms include wheezing.  The patient denies fever and dyspnea.  The patient denies headache.  Risk factors for Strep sinusitis include unilateral facial pain, unilateral nasal discharge, and tooth pain.  The patient denies the following risk factors for Strep sinusitis: tender adenopathy.  Rx: Neti pot. He also wants F/U of lipids.  Current Medications (verified): 1)  Aleve .Marland Kitchen.. 2 Tablets At Bedtime 2)  Multivitiamin 3)  Vit C  Allergies: 1)  ! Pcn 2)  ! Sulfa 3)  ! * Latex  Family History: Father: COAD Mother: breast  cancer  Siblings: bro: 4 vessel CBAG @ 9 & endarterectomy; sister: breast cancer  Puncle & M uncle  : MI in 11s; MG  aunt :CAD  Physical Exam  General:  in no acute distress; alert,appropriate and cooperative throughout examination Ears:  External ear exam shows no significant lesions or deformities.  Otoscopic examination reveals clear canals, tympanic membranes are intact bilaterally without bulging, retraction, inflammation or discharge. Hearing is grossly normal bilaterally. Nose:  External nasal examination shows no deformity or inflammation. Nasal mucosa are pink and moist without lesions or exudates. Septum deviated to R , dislocation Mouth:  Oral mucosa and oropharynx without lesions or exudates.   Teeth in good repair. Lungs:  Normal respiratory effort, chest expands symmetrically. Lungs are clear to auscultation, no crackles or wheezes. Cervical Nodes:  No lymphadenopathy noted Axillary Nodes:  No palpable lymphadenopathy   Impression & Recommendations:  Problem # 1:  SINUSITIS- ACUTE-NOS (ICD-461.9)  The following medications were removed from the medication list:    Levaquin 750 Mg Tabs (Levofloxacin) ..... One tablet by mouth daily x 5 days    Tessalon Perles 100 Mg Caps (Benzonatate) ..... One cap by mouth three times a day as needed cough His updated medication list for this problem includes:    Clarithromycin 500 Mg Xr24h-tab (Clarithromycin) .Marland Kitchen... 2 once daily with a meal    Fluticasone Propionate 50 Mcg/act Susp (Fluticasone propionate) .Marland Kitchen... 1 spray two times a day  Problem # 2:  HYPERLIPIDEMIA (ICD-272.4)  Orders: Venipuncture (91478) T- * Misc. Laboratory test [phone])  Problem # 3:  CORONARY ARTERY DISEASE, FAMILY HX (ICD-V17.3)  Orders: Venipuncture (13086) T- * Misc. Laboratory test [phone])  Complete Medication List: 1)  Aleve  .Marland Kitchen.. 2 tablets at bedtime 2)  Multivitiamin  3)  Vit C  4)  Clarithromycin 500 Mg Xr24h-tab (Clarithromycin) .... 2 once daily with a meal 5)  Fluticasone Propionate 50 Mcg/act Susp (Fluticasone propionate) .Marland Kitchen.. 1 spray two times a day  Patient Instructions: 1)  Drink as much fluid as you can tolerate for the next  few days. Prescriptions: FLUTICASONE PROPIONATE 50 MCG/ACT SUSP (FLUTICASONE PROPIONATE) 1 spray two times a day  #1 x 5   Entered and Authorized by:   Marga Melnick MD   Signed by:   Marga Melnick MD on 04/09/2010   Method used:   Print then Give to Patient   RxID:   [id number] CLARITHROMYCIN 500 MG XR24H-TAB (CLARITHROMYCIN) 2 once daily with a meal  #20 x 0   Entered and Authorized by:   Marga Melnick MD   Signed by:   Marga Melnick MD on 04/09/2010   Method used:   Print then Give to Patient   RxID:    [id number]    Orders Added: 1)  Est. Patient Level III [50093] 2)  Venipuncture [81829] 3)  T- * Misc. Laboratory test [phone]

## 2010-05-02 NOTE — Assessment & Plan Note (Signed)
Summary: NP,ORTHOTICS,L PF,MC   Vital Signs:  Patient profile:   60 year old male Height:      75 inches Weight:      212 pounds BMI:     26.59 Pulse rate:   78 / minute BP sitting:   135 / 80  (left arm)  Vitals Entered By: Rochele Pages RN (March 19, 2010 8:43 AM) CC: eval for orthotics- L heel pain   CC:  eval for orthotics- L heel pain.  History of Present Illness: Pt presents to clinic for eval for orthotics.  States he developed intermittent L heel pain about 4 months ago.  Pain is worse in the mornings, worse after running.  Saw Dr. Thurston Hole who wanted to do an injection, but pt declined- tried exercises instead. Left heel still painful but not in mornings - more with too much pressure   Pt also has low back pain with radicular symtpoms down the outter left leg and toes.      this has done well with PT thru [facility] no radicular sxs now  Preventive Screening-Counseling & Management  Alcohol-Tobacco     Smoking Status: never  Allergies: 1)  ! Pcn 2)  ! Sulfa 3)  ! * Latex  Physical Exam  General:  Well-developed,well-nourished,in no acute distress; alert,appropriate and cooperative throughout examination Msk:  Bilat cavus foot, high curves splaying between 1st and 2nd toes bunionette on L not R Bilat mild calcaneal varus Good great toe motion bilat No morton's calus  TMT bossing bilat Tenderness over os calsus, not at medial insertion of plantar fascia on Left  Rt and Lt straight leg raise neg Neg faber test Hip flexion strength good bilat Good abduction strength bilat Additional Exam:  MSK [place] note PF on left is 0.53 On RT PF is 0.49 very little difference although membrane distal to heal is thickened on left at 0.35 about 3 cms down from heel there is a small fragment of boney abnormality over left os calcis   Impression & Recommendations:  Problem # 1:  HEEL PAIN, LEFT (ICD-729.5)  This seems more consistent with bone brusie of os calcis put into  sports insole with heel pad heel pad also added to dr scholl insole  arch support added w scaphoid pads  felt much better after trying this and could run without pain  I am OK with trying to restart running gradually  do PF stretches and exercises to help w left arch strain  reck 4 to 6 wks  Orders: [place] LIMITED (19147) Sports Insoles (W2956)  Problem # 2:  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 2 tablets at bedtime 2)  Multivitiamin  3)  Vit C  4)  Levaquin 750 Mg Tabs (Levofloxacin) .... One tablet by mouth daily x 5 days 5)  Tessalon Perles 100 Mg Caps (Benzonatate) .... One cap by mouth three times a day as needed cough   Orders Added: 1)  New Patient Level II [99202] 2)  [place] LIMITED [76882] 3)  Sports Insoles [L3510]

## 2010-05-02 NOTE — Progress Notes (Signed)
  Phone Note Call from Patient   Summary of Call: Pt called stating he has started taking his gabapentin per discussion with Dr. Darrick Penna.  States he only has 1 left.  Would like rx for gabapentin 300 mg- takes two times a day currently, but old rx said to increase to three times a day if needed.  Is it ok to send this? Initial call taken by: Rochele Pages RN,  April 26, 2010 11:49 AM  Follow-up for Phone Call        [facility] per Dr. Darrick Penna to send Gabapentin rx Follow-up by: Rochele Pages RN,  April 26, 2010 2:12 PM    New/Updated Medications: GABAPENTIN 300 MG CAPS (GABAPENTIN) take 1 by mouth three times a day Prescriptions: GABAPENTIN 300 MG CAPS (GABAPENTIN) take 1 by mouth three times a day  #90 x 3   Entered by:   Rochele Pages RN   Authorized by:   Enid Baas MD   Signed by:   Rochele Pages RN on 04/26/2010   Method used:   [facility] [facility]       [address]       [place], [place]  [address]       Ph: [phone] or [phone]       Fax: [phone]   RxID:   [id number]

## 2010-05-08 ENCOUNTER — Telehealth: Payer: Self-pay | Admitting: Sports Medicine

## 2010-05-20 ENCOUNTER — Ambulatory Visit (INDEPENDENT_AMBULATORY_CARE_PROVIDER_SITE_OTHER): Payer: BC Managed Care – PPO | Admitting: Sports Medicine

## 2010-05-20 ENCOUNTER — Encounter: Payer: Self-pay | Admitting: Sports Medicine

## 2010-05-20 DIAGNOSIS — M542 Cervicalgia: Secondary | ICD-10-CM

## 2010-05-20 DIAGNOSIS — M5137 Other intervertebral disc degeneration, lumbosacral region: Secondary | ICD-10-CM

## 2010-05-20 DIAGNOSIS — M7750 Other enthesopathy of unspecified foot: Secondary | ICD-10-CM

## 2010-05-20 DIAGNOSIS — M79609 Pain in unspecified limb: Secondary | ICD-10-CM

## 2010-05-22 NOTE — Progress Notes (Signed)
Summary: med question  Phone Note Call from Patient   Caller: Patient Summary of Call: Pt says he is up to 3 gabapentin daily and he feels moody and drowsy.  He is planning to drop back to 1 cap daily.  States his nerve pain is worse when he does not take 3 daily.  Wants to know if he can break caps in half- advised him not to do this because it would effect the rate the med is released.  Advised will notifiy Dr. Darrick Penna, and ask for his input.  Initial call taken by: Rochele Pages RN,  May 08, 2010 4:11 PM     Appended Document: med question discuissed trying some 100 mgm tabs during day or increasing dose at night  Appended Document: med question Per Dr. Darrick Penna- pt should try taking 2 gabapentin at night.  He should alternate taking amitriptylene with the gabapentin, and the next night without amitriptylene to determine which works better.

## 2010-05-28 NOTE — Assessment & Plan Note (Signed)
Summary: FU/MC/MJD   Vital Signs:  Patient profile:   60 year old male BP sitting:   137 / 80  Vitals Entered By: Lillia Pauls CMA (May 20, 2010 10:41 AM)  History of Present Illness: Pt here to f/u on Lt heel pain- now 3/10, was 7/10 at last visit. orthotics helpful- has significant heel pain if uses any shoes without orthotics.  Using elliptical with no problems. Continues with home exercise regimen started at PT for back.  Has most pain with sitting.  Taking 1 gabapentin around 11 am, and 2 gabapentin and 2 aleve at bedtime- this is helping, but causes drowsiness in the morning.  Moodiness has improved.  Stopped amitriptylene last week.       has has hand numbness for years following meningitis  thought by neurosurgeon to be some effect on cervical spine this wakes him up several times a night not much better w his gabapentin this affects both hands has tried night splints w maybe some help  Allergies: 1)  ! Pcn 2)  ! Sulfa 3)  ! * Latex  Social History: Reviewed history from 07/18/2008 and no changes required. Occupation: Disability from sequellae of bacterial meningitis His occupation was a Chief Technology Officer his numbness in hands limits his ability to do this work again  Married Never Smoked Alcohol use-yes Regular exercise-yes  Physical Exam  General:  Well-developed,well-nourished,in no acute distress; alert,appropriate and cooperative throughout examination Neck:  he has limited ROM in extension lateral bending rotation is limited to 30 deg RT and LT  Msk:  Lt heel not significantly tender to palpation Foot motion good PF nontender on Lt High arch TMT bossing noted  Rt foot- slight pain over 4th metatarsal Leg lengths equal  FABER neg SI joints move    Impression & Recommendations:  Problem # 1:  HEEL PAIN, LEFT (ICD-729.5) This is primarily an injury to os calcis orthotics seem to be helping so will cont with this  approach  Problem # 2:  NECK PAIN (ICD-723.1)  Orders: Cervical Foam Collar Non Adjustable (L0120)  will try cervical collar I want to review CSpine films to see if he has DJD or DDD may require more workup as I think this is causing hand numbness rather than peripheral neuropathy for other cause wokr posture and neck motion  He does plan to see an Occ therapist from disability services to see if there are other options  This would have to improve for him to be able to do precision technical work  Problem # 3:  METATARSALGIA (ICD-726.70) pads were uncomfortabel better in orthotics but will add support on undersurface if this recurs  Problem # 4:  DEGENERATIVE DISC DISEASE, LUMBAR SPINE (ICD-722.52) needs to cont his rehab exercises and I added a few knee to chest stretches  reck by me in 3 mos pending response to his meds  Complete Medication List: 1)  Aleve  .Marland Kitchen.. 2 tablets at bedtime 2)  Multivitiamin  3)  Vit C  4)  Clarithromycin 500 Mg Xr24h-tab (Clarithromycin) .... 2 once daily with a meal 5)  Fluticasone Propionate 50 Mcg/act Susp (Fluticasone propionate) .Marland Kitchen.. 1 spray two times a day 6)  Amitriptyline Hcl 25 Mg Tabs (Amitriptyline hcl) .... Take 1 by mouth at bedtime 7)  Gabapentin 300 Mg Caps (Gabapentin) .... Take 1 by mouth three times a day  Patient Instructions: 1)  Try cervical collar for neck sxs that affect numbness in hands 2)  use for sleep 3)  if this works you may want to keep using 4)  during day work on posture - chin tuck 5)  keep up back exercises - add a few stretches of knee to chest gently =- prop up if needed 6)  gabapentin - on bad day add a second one during day 7)  you could also go up to as many as 4 aleve during day 8)  3 months recheck   Orders Added: 1)  Cervical Foam Collar Non Adjustable [L0120] 2)  Est. Patient Level IV [[address]]

## 2010-08-12 ENCOUNTER — Ambulatory Visit (INDEPENDENT_AMBULATORY_CARE_PROVIDER_SITE_OTHER): Payer: BC Managed Care – PPO | Admitting: Sports Medicine

## 2010-08-12 VITALS — BP 112/69

## 2010-08-12 DIAGNOSIS — M542 Cervicalgia: Secondary | ICD-10-CM

## 2010-08-12 DIAGNOSIS — M79609 Pain in unspecified limb: Secondary | ICD-10-CM

## 2010-08-12 DIAGNOSIS — M5137 Other intervertebral disc degeneration, lumbosacral region: Secondary | ICD-10-CM

## 2010-08-12 MED ORDER — GABAPENTIN 300 MG PO CAPS: 300.0000 mg | ORAL_CAPSULE | Freq: Four times a day (QID) | ORAL | Status: DC | PRN

## 2010-08-12 NOTE — Patient Instructions (Addendum)
Continue gabapentin  Continue using orthotics Continue back exercises suggested by Madelyn Brunner to use treadmill in intervals - for approx 20 minutes at a time

## 2010-08-12 NOTE — Progress Notes (Signed)
  Subjective:    Patient ID: Charles Hopkins, male    DOB: January 31, 1951, 60 y.o.   MRN: [id number]  HPI  Pt here to f/u of lt heel pain which has improved significantly, does not feel pain with orthotics   C-spine responds well to manipulation by chiropractor.  Uses cervical collar, but gets hot at night. Does pelvic tilt and plank which helps low back pain Cannot sit or stand more than 10-15 min without pain.  With increased standing rt leg feels numb, and toes curl.  Walking makes back feel better.  Ran on treadmill last week for 3 miles- this aggravated back Taking 2-4 gabapentin daily depending on pain level.  States gabapentin very helpful for symptom relief.   Review of Systems     Objective:   Physical Exam     Neck exam: 15 degrees extension, full flexion, 30 degrees lt lat rotation, 40 deg of rt lat rotation Very tight lateral bend bilaterally  Neg straight leg raise bilat FABER neg bilat Heel, toe, lateral foot walking all normal Cross over walking is normal      Assessment & Plan:

## 2010-08-12 NOTE — Assessment & Plan Note (Signed)
This is pretty severe and he does get sciatic radiation on the right side. Nevertheless he has had a good response to gabapentin so I think he should continue this and exercises. I do not see any current value in more aggressive therapy when he is able to keep his exercise level and activities of daily living as good as they are now. I would like to recheck this in 4 months.

## 2010-08-12 NOTE — Assessment & Plan Note (Signed)
Orthotics have essentially eliminated his heel pain. However the symptoms returned when he doesn't use him so I suggested using them in virtually all of his shoes.

## 2010-08-12 NOTE — Assessment & Plan Note (Signed)
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 I think he can continue with manipulation and can use the collar for rest as needed.

## 2010-08-13 NOTE — Assessment & Plan Note (Signed)
[facility]                         GASTROENTEROLOGY OFFICE NOTE   STEPHENS, SHREVE                          MRN:          [id number]  DATE:08/25/2006                            DOB:          1950/04/05    PRIMARY CARE PHYSICIAN:  Dr. Peyton Najjar.   CHIEF COMPLAINT:  Diarrhea since colonoscopy in February.   HISTORY OF PRESENT ILLNESS:  Charles Hopkins is a very pleasant 60 year old  man who underwent routine screening colonoscopy in mid February 2008.  That colonoscopy was normal to the siccum, a small hyperplastic polyp  was removed from his rectum.  His next scheduled colonoscopy would not  be for 10 years from now.  Since around the time of his colonoscopy he  has noticed a definite change in his bowel habits.  Previously he was  somewhat constipated, sometimes having to take a laxative to get his  bowels to move.  Ever since the colonoscopy he has had multiple fairly  loose watery stools daily.  This can be up to 6 stools a day, usually it  is around 4.  He has no nocturnal symptoms.  He has had no new medicines  and no antibiotics for many, many months.  Of interesting note, his  daughter confided in him the day after the colonoscopy that her husband  was having a affair and he admits that this has been definitely on his  mind ever since then.  He tried [employer] without help.  He stopped  Aleve which he was taken without a change.   REVIEW OF SYSTEMS:  Notable for stable weight, no new rashes on skin, no  lumps or bumps, no eye symptoms, no new joint pains.  The rest of his  review of systems was essentially normal and is available on his nursing  intake sheet.   PAST MEDICAL HISTORY:  Trigeminal neuralgia with surgery to correct it,  post operative bacterial meningitis, asthma.   CURRENT MEDICATIONS:  Multivitamin and vitamin C.   ALLERGIES:  PENICILLIN AND SULF ANTIBIOTICS.   SOCIAL HISTORY:  Married with 2 daughters, nonsmoker,  nondrinker, eats  small amount of milk a day, drinks 2 cups of coffee daily, chews 1-2  pieces of sugarless gum daily, all of these have been habits of his for  many years.   FAMILY HISTORY:  Mother and sister with breast cancer, brother with  heart disease.   PHYSICAL EXAMINATION:  6 foot 3 inches, 214 pounds, blood pressure  122/78, pulse 64.  CONSTITUTIONAL:  Generally well appearing.  NEUROLOGIC:  Alert and oriented x3.  EYES:  Extraocular movements intact.  MOUTH:  Oropharynx moist, no lesions.  NECK:  Supple, no lymphadenopathy.  CARDIOVASCULAR:  Heart regular rate and rhythm.  LUNGS:  Clear to auscultation bilaterally.  ABDOMEN:  Soft, nontender, nondistended, normal bowel sounds.  EXTREMITIES:  No lower extremity edema.  SKIN:  No rash or lesions on visible extremities.   ASSESSMENT/PLAN:  A 60 year old man with loose stools for the past 3  months started around the time of his a routine screening colonoscopy.   Certainly  colonoscopy is not without risk and there can be  complications, but I have not seen an issue such as this that is related  to colonoscopy.  It seems more likely that it is a coincidence.  He did  get some very bad family news the day after the colonoscopy and perhaps  his stools are related to anxiety and stress over that.  He has always  been thin regardless of what he eats, possibly he is hyperthyroid and it  is finely becoming a little more symptomatic.  __________ can also cause  loose stools, chronic infections as well.  I will arrange for him to  have lab tests done today including CBC, a complete metabolic profile,  TSH, celiac, __________  testing with a tissue trans glutamic acid and  stool studies as well.  I have advised him to begin taking Imodium 1  pill twice daily on a scheduled basis to increase it to 2 pills twice  daily if he notices no improvement and to decrease it if he gets  constipated.  He will return to see me in 3 weeks  regardless of the work  up.     Rachael Fee, MD  Electronically Signed    DPJ/MedQ  DD: 08/25/2006  DT: 08/25/2006  Job #: [id number]   cc:   Titus Dubin. Alwyn Ren, MD,FACP,FCCP

## 2010-09-10 ENCOUNTER — Telehealth: Payer: Self-pay | Admitting: *Deleted

## 2010-09-10 ENCOUNTER — Other Ambulatory Visit: Payer: Self-pay | Admitting: *Deleted

## 2010-09-10 NOTE — Telephone Encounter (Signed)
Spoke with pt- he states the 300 mg gabapentin makes him groogy when he takes it during the day.  Requesting the 100 mg tabs to take during the day.  States the 300 mg tabs work well when he takes them at night- he sleeps well.  Advised will check on this and call him back.

## 2010-09-11 MED ORDER — GABAPENTIN 100 MG PO TABS: 100.0000 mg | ORAL_TABLET | Freq: Three times a day (TID) | ORAL | Status: DC

## 2010-09-11 NOTE — Telephone Encounter (Signed)
Spoke with Dr. Christella Hartigan- he states ok to send Gabapentin 100 mg tabs in for patient.  Pt notified rx sent.

## 2010-10-01 ENCOUNTER — Telehealth: Payer: Self-pay | Admitting: *Deleted

## 2010-10-01 NOTE — Telephone Encounter (Signed)
Message copied by Ulyses Jarred on Tue Oct 01, 2010  3:21 PM ------      Message from: Enid Baas      Created: Tue Oct 01, 2010  1:37 PM      Contact: [phone]       Give him a call and find out what the issue is and see if I need to see him or call him.  Thanks            KBF      ----- Message -----         From: Claiborne Billings         Sent: 10/01/2010  11:06 AM           To: Enid Baas, MD            Would like to talk with you about his disability insurance.  Please call (or could he set up a time to meet with you)

## 2010-10-01 NOTE — Telephone Encounter (Signed)
Pt states his long term disability was cut in half starting last yr.  He had never applied for SS disability before because he was receiving disability through his work.  He has been receiving this since his brain surgery in 2005.  He applied for SS disability and was denied.  Spoke with a lawyer who states that most people get denied, and then appeal the decision.  Pt has appealed the decision because SS said that he has only been disabled since 2010.  He needs verification by his doctor that he has been disabled since 2005.  Asked Pt to bring he medical records from [facility] when he had has brain surgery for Dr. Darrick Penna to review.  Will ask Dr. Darrick Penna if he can help in this matter.

## 2010-10-04 NOTE — Telephone Encounter (Signed)
Per Dr. Darrick Penna: Let him know that we can send a letter documenting his evaluations and treatments from [place]. A disability determination would probably have to come from someone who originally evaluated him when his problems arose or a disability specialist.              Discussed the above with pt.  He is agreeable. He states he will drop his previous records off in case Dr. Darrick Penna needs to review them.

## 2010-12-04 ENCOUNTER — Encounter: Payer: Self-pay | Admitting: Sports Medicine

## 2011-03-05 ENCOUNTER — Other Ambulatory Visit: Payer: Self-pay | Admitting: *Deleted

## 2011-03-05 MED ORDER — GABAPENTIN 300 MG PO CAPS: 300.0000 mg | ORAL_CAPSULE | Freq: Four times a day (QID) | ORAL | Status: DC | PRN

## 2011-03-06 ENCOUNTER — Other Ambulatory Visit: Payer: Self-pay | Admitting: *Deleted

## 2011-03-06 MED ORDER — GABAPENTIN 100 MG PO CAPS: 100.0000 mg | ORAL_CAPSULE | Freq: Three times a day (TID) | ORAL | Status: DC | PRN

## 2011-03-21 ENCOUNTER — Encounter: Payer: Self-pay | Admitting: Family Medicine

## 2011-03-21 ENCOUNTER — Ambulatory Visit (INDEPENDENT_AMBULATORY_CARE_PROVIDER_SITE_OTHER): Payer: BC Managed Care – PPO | Admitting: Family Medicine

## 2011-03-21 VITALS — BP 122/68

## 2011-03-21 DIAGNOSIS — M79609 Pain in unspecified limb: Secondary | ICD-10-CM

## 2011-03-21 DIAGNOSIS — M7750 Other enthesopathy of unspecified foot: Secondary | ICD-10-CM

## 2011-03-21 DIAGNOSIS — G609 Hereditary and idiopathic neuropathy, unspecified: Secondary | ICD-10-CM

## 2011-03-21 NOTE — Progress Notes (Signed)
  Subjective:    Patient ID: Charles Hopkins, male    DOB: 1950-11-04, 60 y.o.   MRN: [id number]  HPI Murrel comes in for followup of his troubling bilateral heel pain. Since he had orthotics made approximately 1 year ago he's been completely pain-free, and has been able to run. He is here for another set as his are somewhat worn. Continues to have paraesthesias in hands, has not developed in his feet. Hx of low backpain--he thinks the orthotics have helped this as well.   Review of Systems    no fevers, chills, night sweats, weight loss. Objective:   Physical Exam  Collapse of both transverse arches with some clawing of the toes. But only minimal abnormal callus under the second through fourth metatarsal heads. High arches bilaterally, these are flexible and flatten out with weightbearing.  Patient was fitted for a : standard, cushioned, semi-rigid orthotic. The orthotic was heated and afterward the patient stood on the orthotic blank positioned on the orthotic stand. The patient was positioned in subtalar neutral position and 10 degrees of ankle dorsiflexion in a weight bearing stance. After completion of molding, a stable base was applied to the orthotic blank. The blank was ground to a stable position for weight bearing. 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 Followup as needed.

## 2011-10-31 ENCOUNTER — Telehealth: Payer: Self-pay | Admitting: *Deleted

## 2011-10-31 NOTE — Telephone Encounter (Signed)
Message copied by Mora Bellman on Fri Oct 31, 2011  1:54 PM ------      Message from: CERESI, Shawna Orleans L      Created: Thu Oct 30, 2011 10:51 AM      Regarding: phone message      Contact: [phone]       Patient would like you to call him.

## 2011-10-31 NOTE — Telephone Encounter (Signed)
Pt called- needs disability forms for this yr filled out and refills.  Scheduled him for f/u appt with Dr. Darrick Penna as it has been over 1 yr since he saw him.

## 2011-10-31 NOTE — Telephone Encounter (Signed)
Left VM to return call 

## 2011-11-18 ENCOUNTER — Ambulatory Visit (INDEPENDENT_AMBULATORY_CARE_PROVIDER_SITE_OTHER): Payer: BC Managed Care – PPO | Admitting: Sports Medicine

## 2011-11-18 VITALS — BP 151/83 | HR 84

## 2011-11-18 DIAGNOSIS — I878 Other specified disorders of veins: Secondary | ICD-10-CM

## 2011-11-18 DIAGNOSIS — M79609 Pain in unspecified limb: Secondary | ICD-10-CM

## 2011-11-18 DIAGNOSIS — G609 Hereditary and idiopathic neuropathy, unspecified: Secondary | ICD-10-CM

## 2011-11-18 DIAGNOSIS — I872 Venous insufficiency (chronic) (peripheral): Secondary | ICD-10-CM

## 2011-11-18 NOTE — Patient Instructions (Addendum)
Please increase your Neurontin at night if you are having symptoms by 100-200 mg   Try Neurontin 100 mg during the day of your toes are cramping up  If you have burning or itching in your hands take 100 mg Neurontin  Try compression sleeve on right calf to help with swelling.  Wear during exercise and 1-2 hours afterwards  Please follow up as needed  Thank you for seeing [place] today!

## 2011-11-18 NOTE — Assessment & Plan Note (Signed)
Try compression sleeve for walking or standing and for exercise  Do not sleep with this on  See if this lessens the sxs of neuropathy and pain in the RT lower leg

## 2011-11-18 NOTE — Progress Notes (Signed)
  Subjective:    Patient ID: Charles Hopkins, male    DOB: 07-23-1950, 61 y.o.   MRN: [id number]  HPI  Pt presents to clinic for f/u of neuropathy in hands and feet. He does still have have hand numbness and pain, and foot pain - toes 3-4 curl up.. Notes his hands burn and itch.  Taking gabapentin 300 mg qhs, and 100 mg 1-2 times per day as needed. Has low back pain. Wears orthotics at all times- very helpful for heel pain.  Note gabapentin really helps at night Helps wrose pain even in day but makes him sleepy  Review of Systems     Objective:   Physical Exam NAD  Good post tib function Subluxation of 5th toe bilat, rest of transverse arch is normal Moderate long arch bilat  No C5-T1 weakness Increased numbness in fingers 1-3 bilat  Venous stasis changes in skin RT lower leg Mild generalized soft tissue swelling rt anterior medial lower leg        Assessment & Plan:

## 2011-11-18 NOTE — Assessment & Plan Note (Signed)
He needs to use somewhat higher doses of gabapentin particualarly when he gets flexion spasms of RT foot or too mcuh burning  Given schedule to increase as needed

## 2011-11-25 ENCOUNTER — Ambulatory Visit: Payer: BC Managed Care – PPO | Admitting: Sports Medicine

## 2011-11-28 ENCOUNTER — Ambulatory Visit: Payer: BC Managed Care – PPO | Admitting: Sports Medicine

## 2012-01-26 ENCOUNTER — Encounter: Payer: Self-pay | Admitting: Internal Medicine

## 2012-01-26 ENCOUNTER — Ambulatory Visit (INDEPENDENT_AMBULATORY_CARE_PROVIDER_SITE_OTHER): Payer: BC Managed Care – PPO | Admitting: Internal Medicine

## 2012-01-26 VITALS — BP 124/80 | HR 92 | Temp 97.5°F | Resp 12 | Ht 75.0 in | Wt 210.0 lb

## 2012-01-26 DIAGNOSIS — T887XXA Unspecified adverse effect of drug or medicament, initial encounter: Secondary | ICD-10-CM | POA: Insufficient documentation

## 2012-01-26 DIAGNOSIS — Z23 Encounter for immunization: Secondary | ICD-10-CM

## 2012-01-26 DIAGNOSIS — Z0000 Encounter for general adult medical examination without abnormal findings: Secondary | ICD-10-CM

## 2012-01-26 LAB — BASIC METABOLIC PANEL
BUN: 15 mg/dL (ref 6–23)
CO2: 29 mEq/L (ref 19–32)
Calcium: 9.6 mg/dL (ref 8.4–10.5)
Chloride: 102 mEq/L (ref 96–112)
Creatinine, Ser: 1 mg/dL (ref 0.4–1.5)
GFR: 81.66 mL/min (ref >60.00)
Glucose, Bld: 94 mg/dL (ref 70–99)
Potassium: 4.4 mEq/L (ref 3.5–5.1)
Sodium: 137 mEq/L (ref 135–145)

## 2012-01-26 LAB — HEPATIC FUNCTION PANEL
ALT: 23 U/L (ref 0–53)
AST: 24 U/L (ref 0–37)
Albumin: 4.2 g/dL (ref 3.5–5.2)
Alkaline Phosphatase: 63 U/L (ref 39–117)
Bilirubin, Direct: 0.2 mg/dL (ref 0.0–0.3)
Total Bilirubin: 1 mg/dL (ref 0.3–1.2)
Total Protein: 7.2 g/dL (ref 6.0–8.3)

## 2012-01-26 LAB — CBC WITH DIFFERENTIAL/PLATELET
Basophils Absolute: 0 10*3/uL (ref 0.0–0.1)
Basophils Relative: 0.3 % (ref 0.0–3.0)
Eosinophils Absolute: 0.1 10*3/uL (ref 0.0–0.7)
Eosinophils Relative: 1.4 % (ref 0.0–5.0)
HCT: 46.9 % (ref 39.0–52.0)
Hemoglobin: 15.3 g/dL (ref 13.0–17.0)
Lymphocytes Relative: 16.7 % (ref 12.0–46.0)
Lymphs Abs: 1.3 10*3/uL (ref 0.7–4.0)
MCHC: 32.7 g/dL (ref 30.0–36.0)
MCV: 90 fl (ref 78.0–100.0)
Monocytes Absolute: 0.7 10*3/uL (ref 0.1–1.0)
Monocytes Relative: 9.1 % (ref 3.0–12.0)
Neutro Abs: 5.8 10*3/uL (ref 1.4–7.7)
Neutrophils Relative %: 72.5 % (ref 43.0–77.0)
Platelets: 292 10*3/uL (ref 150.0–400.0)
RBC: 5.21 Mil/uL (ref 4.22–5.81)
RDW: 13.9 % (ref 11.5–14.6)
WBC: 7.9 10*3/uL (ref 4.5–10.5)

## 2012-01-26 LAB — LIPID PANEL
Cholesterol: 255 mg/dL — ABNORMAL HIGH (ref 0–200)
HDL: 60.1 mg/dL (ref >39.00)
Total CHOL/HDL Ratio: 4
Triglycerides: 109 mg/dL (ref 0.0–149.0)
VLDL: 21.8 mg/dL (ref 0.0–40.0)

## 2012-01-26 LAB — LDL CHOLESTEROL, DIRECT: Direct LDL: 184.2 mg/dL

## 2012-01-26 LAB — TSH: TSH: 3.8 u[IU]/mL (ref 0.35–5.50)

## 2012-01-26 LAB — PSA: PSA: 0.62 ng/mL (ref 0.10–4.00)

## 2012-01-26 NOTE — Progress Notes (Signed)
  Subjective:    Patient ID: Charles Hopkins, male    DOB: 1950-04-19, 61 y.o.   MRN: [id number]  HPI  Mr Gummo is here for a physical; he has no acute issues .      Review of Systems He sees Dr Darrick Penna as needed; gabapentin has been effective for peripheral neuropathy.  He had as some erectile concerns. He denies significant weakness or decreased libido.     Objective:   Physical Exam Gen.: Healthy and well-nourished in appearance. Alert, appropriate and cooperative throughout exam. Head: Normocephalic without obvious abnormalities. Goatee  Eyes: No corneal or conjunctival inflammation noted. Pupils equal round reactive to light and accommodation.  Extraocular motion intact. Vision grossly normal. Ears: External  ear exam reveals no significant lesions or deformities. Canals clear .TMs normal. Hearing is grossly normal bilaterally. Nose: External nasal exam reveals no deformity or inflammation. Nasal mucosa are pink and moist. No lesions or exudates noted. Septum  Deviated to R  Mouth: Oral mucosa and oropharynx reveal no lesions or exudates. Teeth in good repair. Neck: No deformities, masses, or tenderness noted. Range of motion decreased. Thyroid normal. Lungs: Normal respiratory effort; chest expands symmetrically. Lungs are clear to auscultation without rales, wheezes, or increased work of breathing. Heart: Normal rate and rhythm. Normal S1 and S2. No gallop, click, or rub. S4 w/o murmur. Abdomen: Bowel sounds normal; abdomen soft and nontender. No masses, organomegaly or hernias noted. Genitalia/ DRE: Genitalia normal except for left varices & R granuloma.DRE declined due to discomfort in past. External hemorrhoidal tissue. PSA requested Musculoskeletal/extremities: No deformity or scoliosis noted of  the thoracic or lumbar spine. No clubbing, cyanosis,or  edema noted. Range of motion  normal .Tone & strength  Normal.Joints:flexion 5th R finger. Nail health  good. Vascular: Carotid, radial  artery, dorsalis pedis and  posterior tibial pulses are full and equal. No bruits present. Neurologic: Alert and oriented x3. Deep tendon reflexes symmetrical and normal.          Skin: Intact without suspicious lesions or rashes. Lymph: No cervical, axillary, or inguinal lymphadenopathy present. Psych: Mood and affect are normal. Normally interactive                                                                                        Assessment & Plan:  #1 comprehensive physical exam; no acute findings # 2 ED  Plan: see Orders

## 2012-01-26 NOTE — Patient Instructions (Addendum)
Preventive Health Care: Exercise at least 30-45 minutes a day,  3-4 days a week.  Eat a low-fat diet with lots of fruits and vegetables, up to 7-9 servings per day.  Consume less than 40 grams of sugar per day from foods & drinks with High Fructose Corn Sugar as # 1,2,3 or # 4 on label. Eye Doctor - have an eye exam @ least annually.      Take the EKG to any emergency room or preop visits. There are nonspecific changes; as long as there is no new change these are not clinically significant.                                               Health Care Power of Attorney & Living Will. Complete if not in place ; these place you in charge of your health care decisions.  If you activate My Chart; the results can be released to you as soon as they populate from the lab. If you choose not to use this program; the labs have to be reviewed, copied & mailed   causing a delay in getting the results to you.

## 2012-01-31 ENCOUNTER — Encounter: Payer: Self-pay | Admitting: Internal Medicine

## 2012-02-19 ENCOUNTER — Other Ambulatory Visit: Payer: Self-pay | Admitting: *Deleted

## 2012-02-19 MED ORDER — TRAMADOL HCL 50 MG PO TABS: 50.0000 mg | ORAL_TABLET | Freq: Four times a day (QID) | ORAL | Status: DC | PRN

## 2012-02-19 NOTE — Progress Notes (Signed)
Pt having back pain after working in yard yesterday.  He has know DDD, and does home PT exercises for back.  Requesting rx for tramadol.  Per Dr. Darrick Penna- tramadol sent in.

## 2012-04-27 ENCOUNTER — Ambulatory Visit: Payer: BC Managed Care – PPO | Admitting: Sports Medicine

## 2012-04-30 ENCOUNTER — Encounter: Payer: Self-pay | Admitting: *Deleted

## 2012-06-10 ENCOUNTER — Ambulatory Visit (INDEPENDENT_AMBULATORY_CARE_PROVIDER_SITE_OTHER): Payer: BC Managed Care – PPO | Admitting: Sports Medicine

## 2012-06-10 ENCOUNTER — Encounter: Payer: Self-pay | Admitting: Sports Medicine

## 2012-06-10 VITALS — BP 149/89 | HR 73 | Ht 75.0 in | Wt 210.0 lb

## 2012-06-10 DIAGNOSIS — M7500 Adhesive capsulitis of unspecified shoulder: Secondary | ICD-10-CM

## 2012-06-10 DIAGNOSIS — M7501 Adhesive capsulitis of right shoulder: Secondary | ICD-10-CM | POA: Insufficient documentation

## 2012-06-10 MED ORDER — AMITRIPTYLINE HCL 25 MG PO TABS: 25.0000 mg | ORAL_TABLET | Freq: Every day | ORAL | Status: DC

## 2012-06-10 NOTE — Assessment & Plan Note (Signed)
Consent obtained and verified. Sterile betadine prep. Furthur cleansed with alcohol. Topical analgesic spray: Ethyl chloride. Joint: Rt shoulder Approached in typical fashion with: posterior window - directed intra-articular Completed without difficulty Meds: 40 mgm depomedrol + 7 ccs lidocaine 1% Needle: 25 G and 1.5 in Aftercare instructions and Red flags advised.  Begin mortion exercise series  Unless good progress we will get to PT fairly quickly  Reck 1 mo

## 2012-06-10 NOTE — Patient Instructions (Addendum)
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

## 2012-06-10 NOTE — Progress Notes (Signed)
  Subjective:    Patient ID: Charles Hopkins, male    DOB: 08-19-1950, 62 y.o.   MRN: [id number]  HPI  Pt presents to clinic for evaluation of rt shoulder pain x2 months. No injury. Painful with reach behind, or lifting over head. Hard to sleep due to pain.  Trouble with daily tasks like washing back and putting on shirt.   May have been from carrying grand kids  Review of Systems     Objective:   Physical Exam NAD  Rt shoulder Lacking 10 degrees full forward flexion L2 on back scratch 5 degrees of ER at 90 degrees  Abduction 140 degrees  Lt shoulder  T7 on back scratch Abduction 170 degrees 15 degrees of ER at 90 degrees  All rotator cuff testing strong IR, ER, abduction, and bicipital testing     Assessment & Plan:

## 2012-07-15 ENCOUNTER — Ambulatory Visit: Payer: BC Managed Care – PPO | Admitting: Sports Medicine

## 2012-08-12 ENCOUNTER — Ambulatory Visit: Payer: BC Managed Care – PPO | Admitting: Sports Medicine

## 2012-09-20 ENCOUNTER — Other Ambulatory Visit: Payer: Self-pay | Admitting: *Deleted

## 2012-09-20 MED ORDER — GABAPENTIN 300 MG PO CAPS: 300.0000 mg | ORAL_CAPSULE | Freq: Four times a day (QID) | ORAL | Status: DC | PRN

## 2012-12-06 ENCOUNTER — Telehealth: Payer: Self-pay | Admitting: Gastroenterology

## 2012-12-06 NOTE — Telephone Encounter (Signed)
Pt was given appt to discuss colon he has a recall in 2018.  He states that his insurance is asking him to have one now.  He also is having GERD symptoms and would like to discuss

## 2013-01-10 ENCOUNTER — Encounter: Payer: Self-pay | Admitting: Gastroenterology

## 2013-01-10 ENCOUNTER — Other Ambulatory Visit: Payer: Self-pay

## 2013-01-10 ENCOUNTER — Ambulatory Visit (INDEPENDENT_AMBULATORY_CARE_PROVIDER_SITE_OTHER): Payer: BC Managed Care – PPO | Admitting: Gastroenterology

## 2013-01-10 VITALS — BP 110/60 | HR 73 | Ht 75.0 in | Wt 209.0 lb

## 2013-01-10 DIAGNOSIS — D126 Benign neoplasm of colon, unspecified: Secondary | ICD-10-CM

## 2013-01-10 DIAGNOSIS — R1013 Epigastric pain: Secondary | ICD-10-CM

## 2013-01-10 MED ORDER — ESOMEPRAZOLE MAGNESIUM 40 MG PO CPDR: 40.0000 mg | DELAYED_RELEASE_CAPSULE | Freq: Every day | ORAL | Status: DC

## 2013-01-10 NOTE — Progress Notes (Signed)
Review of pertinent gastrointestinal problems: 1. Routine risk for colon cancer:  Colonoscopy Christella Hartigan 05/2006 done for FH of polyps, found 1mm polyp, was HP on pathology, recommended recall at 10 year interval.   HPI: This is a very pleasant 62 year old man whom I last saw about 6 years ago.  Daughter has celiac sprue, proven by blood test per patient.  He has intermittent abd pains, can have loose stools at times.  He had epigastric pains.  Same thing happened 20 years ago, at that time took PPI for 14 days and it worked.  Has now been on PPI for 6 weeks,  The pain was consttnt, worse after eating.  No nausea or vomiting.  Started to have loose stools around that time.  Pain did not radiate.  Pain would last 20 min, definitely post prandial in timing.  prilosec clearly helped, 2 week course but pain returned after he stopped.  Did 3 courses of 14 ppi.  Takes alleve every day for 15 years.  Takes for joint pains, neuropathy post brain surgey.  For the past year he has cut out a lot of junk food and eating almonds.     Review of systems: Pertinent positive and negative review of systems were noted in the above HPI section. Complete review of systems was performed and was otherwise normal.    Past Medical History  Diagnosis Date  . Asthma   . Colon polyp   . Peripheral neuropathy     Past Surgical History  Procedure Laterality Date  . Tonsillectomy and adenoidectomy    . Trigeminal neuralgia surgery  2005    complicated by meningitis; [facility], Dr Angelyn Punt  . Colonoscopy with polypectomy  2007    Dr Drue Flirt    Current Outpatient Prescriptions  Medication Sig Dispense Refill  . ASCORBIC ACID PO Take 2,000 mg by mouth daily.      Marland Kitchen gabapentin (NEURONTIN) 300 MG capsule Take 1 capsule (300 mg total) by mouth 4 (four) times daily as needed.  120 capsule  1  . Multiple Vitamin (MULTIVITAMIN) capsule Take 1 capsule by mouth daily.      . naproxen sodium (ALEVE) 220 MG tablet Take  220 mg by mouth. 2 by mouth daily      . gabapentin (NEURONTIN) 100 MG capsule Take 1 capsule (100 mg total) by mouth 3 (three) times daily as needed.  90 capsule  3   No current facility-administered medications for this visit.    Allergies as of 01/10/2013 - Review Complete 01/10/2013  Allergen Reaction Noted  . Latex    . Penicillins    . Sulfonamide derivatives      Family History  Problem Relation Age of Onset  . Breast cancer Mother   . Breast cancer Sister   . Breast cancer      MG aunts, X 3  . Heart attack Maternal Uncle     in 18s  . Heart attack Paternal Uncle     in 58s  . Stroke Neg Hx   . Diabetes Neg Hx     History   Social History  . Marital Status: Married    Spouse Name: N/A    Number of Children: N/A  . Years of Education: N/A   Occupational History  . Not on file.   Social History Main Topics  . Smoking status: Never Smoker   . Smokeless tobacco: Never Used  . Alcohol Use: Yes     Comment: Wine-infrequently   .  Drug Use: No  . Sexual Activity: Not on file   Other Topics Concern  . Not on file   Social History Narrative  . No narrative on file       Physical Exam: BP 110/60  Pulse 73  Ht 6\' 3"  (1.905 m)  Wt 209 lb (94.802 kg)  BMI 26.12 kg/m2  SpO2 96% Constitutional: generally well-appearing Psychiatric: alert and oriented x3 Eyes: extraocular movements intact Mouth: oral pharynx moist, no lesions Neck: supple no lymphadenopathy Cardiovascular: heart regular rate and rhythm Lungs: clear to auscultation bilaterally Abdomen: soft, nontender, nondistended, no obvious ascites, no peritoneal signs, normal bowel sounds Extremities: no lower extremity edema bilaterally Skin: no lesions on visible extremities    Assessment and plan: 62 y.o. male with  family history of celiac sprue, recent intermittent epigastric post prandial pains, daily NSAID use, routine risk for colon cancer.   First he tells me that his insurance company  is telling him that he is due for colonoscopy however I do not see that to be the case. He had a hyperplastic polyp removed in 2008, he is not due for routine colon cancer screening until 2018. Second his intermittent postprandial epigastric pains are probably related to his daily NSAID use. They are clearly improved with proton pump inhibitor. He does however have recent diagnosis of celiac sprue in his daughter and he tends to have loose stools. I would like to proceed with EGD to check him for peptic ulcer disease, perhaps H. pylori, also I will plan to perform duodenal biopsies to see if he has celiac sprue. He noticed a continue taking proton pump inhibitor one pill once daily for as long as he requires daily NSAIDs as this will reduce the chance of him having peptic complications.

## 2013-01-10 NOTE — Patient Instructions (Signed)
Alleve may be contributing to abd pains. You should be taking PPI (such as nexium, omeprazole, prevacid) one pill once daily for as long as you need to be on Alleve. You should always consider generic meds. You will be set up for an upper endoscopy, will plan on duodenal biopsy given concern for celiac sprue now.(LEC, moderate sedatio) Your 2008 polyp was NOT precancerous and so you do not need screening colonoscopy until 05/2016.

## 2013-01-11 ENCOUNTER — Other Ambulatory Visit: Payer: BC Managed Care – PPO | Admitting: Gastroenterology

## 2013-01-25 ENCOUNTER — Telehealth: Payer: Self-pay | Admitting: Gastroenterology

## 2013-01-25 ENCOUNTER — Other Ambulatory Visit: Payer: BC Managed Care – PPO | Admitting: Gastroenterology

## 2013-01-25 NOTE — Telephone Encounter (Signed)
ok 

## 2013-01-31 ENCOUNTER — Other Ambulatory Visit: Payer: Self-pay | Admitting: *Deleted

## 2013-01-31 MED ORDER — TRAMADOL HCL 50 MG PO TABS: 50.0000 mg | ORAL_TABLET | Freq: Four times a day (QID) | ORAL | Status: DC | PRN

## 2013-02-09 ENCOUNTER — Telehealth: Payer: Self-pay

## 2013-02-09 NOTE — Telephone Encounter (Signed)
Medication List and allergies: reviewed and updated  90 day supply/mail order: na Local prescriptions: [facility] [address] [place]  Immunizations due: unsure  A/P:   No changes to FH or PSH Tdap--12/2011 [facility]--05/2006--next 2018 PSA--12/2011--0.62  To Discuss with Provider: Would like blood test for Celiac Disease/daughter was recently dx Father had same type of symptoms  A screening panel test (#[id number]) = $225 if insurance doesn't pay

## 2013-02-10 ENCOUNTER — Encounter: Payer: Self-pay | Admitting: Internal Medicine

## 2013-02-10 ENCOUNTER — Ambulatory Visit (INDEPENDENT_AMBULATORY_CARE_PROVIDER_SITE_OTHER): Payer: BC Managed Care – PPO | Admitting: Internal Medicine

## 2013-02-10 VITALS — BP 100/60 | HR 64 | Temp 97.2°F | Ht 74.75 in | Wt 208.2 lb

## 2013-02-10 DIAGNOSIS — R198 Other specified symptoms and signs involving the digestive system and abdomen: Secondary | ICD-10-CM

## 2013-02-10 DIAGNOSIS — Z23 Encounter for immunization: Secondary | ICD-10-CM

## 2013-02-10 DIAGNOSIS — Z0000 Encounter for general adult medical examination without abnormal findings: Secondary | ICD-10-CM

## 2013-02-10 DIAGNOSIS — Z8669 Personal history of other diseases of the nervous system and sense organs: Secondary | ICD-10-CM | POA: Insufficient documentation

## 2013-02-10 DIAGNOSIS — D126 Benign neoplasm of colon, unspecified: Secondary | ICD-10-CM

## 2013-02-10 DIAGNOSIS — R194 Change in bowel habit: Secondary | ICD-10-CM

## 2013-02-10 DIAGNOSIS — E785 Hyperlipidemia, unspecified: Secondary | ICD-10-CM

## 2013-02-10 LAB — CBC WITH DIFFERENTIAL/PLATELET
Basophils Absolute: 0 10*3/uL (ref 0.0–0.1)
Basophils Relative: 0.4 % (ref 0.0–3.0)
Eosinophils Absolute: 0.1 10*3/uL (ref 0.0–0.7)
Eosinophils Relative: 1.7 % (ref 0.0–5.0)
HCT: 43.9 % (ref 39.0–52.0)
Hemoglobin: 14.6 g/dL (ref 13.0–17.0)
Lymphocytes Relative: 19.2 % (ref 12.0–46.0)
Lymphs Abs: 1.3 10*3/uL (ref 0.7–4.0)
MCHC: 33.4 g/dL (ref 30.0–36.0)
MCV: 87.9 fl (ref 78.0–100.0)
Monocytes Absolute: 0.6 10*3/uL (ref 0.1–1.0)
Monocytes Relative: 9.2 % (ref 3.0–12.0)
Neutro Abs: 4.6 10*3/uL (ref 1.4–7.7)
Neutrophils Relative %: 69.5 % (ref 43.0–77.0)
Platelets: 285 10*3/uL (ref 150.0–400.0)
RBC: 4.99 Mil/uL (ref 4.22–5.81)
RDW: 14 % (ref 11.5–14.6)
WBC: 6.7 10*3/uL (ref 4.5–10.5)

## 2013-02-10 LAB — LIPID PANEL
Cholesterol: 229 mg/dL — ABNORMAL HIGH (ref 0–200)
HDL: 47.7 mg/dL (ref >39.00)
Total CHOL/HDL Ratio: 5
Triglycerides: 61 mg/dL (ref 0.0–149.0)
VLDL: 12.2 mg/dL (ref 0.0–40.0)

## 2013-02-10 LAB — HEPATIC FUNCTION PANEL
ALT: 18 U/L (ref 0–53)
AST: 23 U/L (ref 0–37)
Albumin: 4.3 g/dL (ref 3.5–5.2)
Alkaline Phosphatase: 66 U/L (ref 39–117)
Bilirubin, Direct: 0 mg/dL (ref 0.0–0.3)
Total Bilirubin: 1 mg/dL (ref 0.3–1.2)
Total Protein: 7.4 g/dL (ref 6.0–8.3)

## 2013-02-10 LAB — BASIC METABOLIC PANEL
BUN: 13 mg/dL (ref 6–23)
CO2: 28 mEq/L (ref 19–32)
Calcium: 9.3 mg/dL (ref 8.4–10.5)
Chloride: 102 mEq/L (ref 96–112)
Creatinine, Ser: 1 mg/dL (ref 0.4–1.5)
GFR: 85.35 mL/min (ref >60.00)
Glucose, Bld: 86 mg/dL (ref 70–99)
Potassium: 4.1 mEq/L (ref 3.5–5.1)
Sodium: 137 mEq/L (ref 135–145)

## 2013-02-10 LAB — TSH: TSH: 2.23 u[IU]/mL (ref 0.35–5.50)

## 2013-02-10 LAB — LDL CHOLESTEROL, DIRECT: Direct LDL: 168 mg/dL

## 2013-02-10 NOTE — Progress Notes (Signed)
  Subjective:    Patient ID: Charles Hopkins, male    DOB: 03-Nov-1950, 62 y.o.   MRN: [id number]  HPI  He is here for a physical;acute issues include GERD , responsive to Nexium.     Review of Systems  He was seen by Dr. Christella Hartigan last month for abdominal pain. Endoscopy was recommended; unfortunately, this was not covered by insurance;his personal cost would have been $3000. His symptoms have improved on Nexium. He is concerned he may have gluten enteropathy as his daughter has this. He has intermittent constipation and diarrhea. Ice cream causes frank diarrhea, gas & cramping.     Objective:   Physical Exam Gen.: Healthy and well-nourished in appearance. Alert, appropriate and cooperative throughout exam.Appears younger than stated age  Head: Normocephalic without obvious abnormalities; goatee  Eyes: No corneal or conjunctival inflammation noted. Pupils equal round reactive to light and accommodation. Extraocular motion intact.  Ears: External  ear exam reveals no significant lesions or deformities. Canals clear .TMs normal. Hearing is grossly normal bilaterally. Nose: External nasal exam reveals no deformity or inflammation. Nasal mucosa are pink and moist. No lesions or exudates noted. Septum to R  Mouth: Oral mucosa and oropharynx reveal no lesions or exudates. Teeth in good repair. Neck: No deformities, masses, or tenderness noted. Range of motion decreased. Thyroid normal. Lungs: Normal respiratory effort; chest expands symmetrically. Lungs are clear to auscultation without rales, wheezes, or increased work of breathing. Heart: Normal rate and rhythm. Accentuated S1 ;normal S2. No gallop, click, or rub. No murmur. Abdomen: Bowel sounds normal; abdomen soft and nontender. No masses, organomegaly or hernias noted. Genitalia: Genitalia normal except for R granuloma &  left varices. Sphincter tight ; exam uncomfortable preventing completion of exam         Musculoskeletal/extremities: No deformity  or scoliosis noted of  the thoracic or lumbar spine.  No clubbing, cyanosis, edema, or significant extremity  deformity noted. Range of motion normal .Tone & strength normal. Hand joints  reveal mild  Fusiform PIP changes. Fingernail health good. Able to lie down & sit up w/o help. Negative SLR bilaterally Vascular: Carotid, radial artery, dorsalis pedis and  posterior tibial pulses are full and equal. No bruits present. Neurologic: Alert and oriented x3. Deep tendon reflexes symmetrical and normal.       Skin: Intact without suspicious lesions or rashes. Lymph: No cervical, axillary, or inguinal lymphadenopathy present. Psych: Mood and affect are normal. Normally interactive                                                                                        Assessment & Plan:  #1 comprehensive physical exam; no acute findings #2bowel changes; R/O lactose or gluten intolerance  Plan: see Orders  & Recommendations

## 2013-02-10 NOTE — Progress Notes (Signed)
Pre visit review using our clinic review tool, if applicable. No additional management support is needed unless otherwise documented below in the visit note. 

## 2013-02-10 NOTE — Patient Instructions (Signed)
Your next office appointment will be determined based upon review of your pending labs . Those instructions will be transmitted to you  by mail. 

## 2013-02-11 ENCOUNTER — Encounter: Payer: Self-pay | Admitting: *Deleted

## 2013-02-16 LAB — GLIA (IGA/G) + TTG IGA
Gliadin IgA: 3.5 U/mL (ref <20)
Gliadin IgG: 5.7 U/mL (ref <20)
Tissue Transglutaminase Ab, IgA: 3.4 U/mL (ref <20)

## 2013-02-21 ENCOUNTER — Encounter: Payer: Self-pay | Admitting: *Deleted

## 2013-03-04 ENCOUNTER — Other Ambulatory Visit: Payer: Self-pay | Admitting: *Deleted

## 2013-03-04 MED ORDER — TRAMADOL HCL 50 MG PO TABS: 50.0000 mg | ORAL_TABLET | Freq: Four times a day (QID) | ORAL | Status: DC | PRN

## 2013-08-05 ENCOUNTER — Other Ambulatory Visit: Payer: Self-pay | Admitting: *Deleted

## 2013-08-05 MED ORDER — TRAMADOL HCL 50 MG PO TABS: 50.0000 mg | ORAL_TABLET | Freq: Four times a day (QID) | ORAL | Status: DC | PRN

## 2013-08-08 ENCOUNTER — Other Ambulatory Visit: Payer: Self-pay | Admitting: *Deleted

## 2013-08-08 MED ORDER — TRAMADOL HCL 50 MG PO TABS: 50.0000 mg | ORAL_TABLET | Freq: Four times a day (QID) | ORAL | Status: DC | PRN

## 2013-09-01 ENCOUNTER — Ambulatory Visit (INDEPENDENT_AMBULATORY_CARE_PROVIDER_SITE_OTHER): Payer: BC Managed Care – PPO | Admitting: Sports Medicine

## 2013-09-01 ENCOUNTER — Encounter: Payer: Self-pay | Admitting: Sports Medicine

## 2013-09-01 VITALS — BP 132/89 | Ht 75.0 in | Wt 206.0 lb

## 2013-09-01 DIAGNOSIS — M719 Bursopathy, unspecified: Secondary | ICD-10-CM

## 2013-09-01 DIAGNOSIS — M67912 Unspecified disorder of synovium and tendon, left shoulder: Secondary | ICD-10-CM | POA: Insufficient documentation

## 2013-09-01 DIAGNOSIS — M6790 Unspecified disorder of synovium and tendon, unspecified site: Secondary | ICD-10-CM

## 2013-09-01 MED ORDER — METHYLPREDNISOLONE ACETATE 40 MG/ML IJ SUSP
40.0000 mg | Freq: Once | INTRAMUSCULAR | Status: AC
  Administered 2013-09-01: 40 mg via INTRA_ARTICULAR

## 2013-09-01 MED ORDER — NITROGLYCERIN 0.2 MG/HR TD PT24: MEDICATED_PATCH | TRANSDERMAL | Status: DC

## 2013-09-01 MED ORDER — TRAMADOL HCL 50 MG PO TABS: 50.0000 mg | ORAL_TABLET | Freq: Four times a day (QID) | ORAL | Status: DC | PRN

## 2013-09-01 NOTE — Assessment & Plan Note (Signed)
Corticosteroid injection was given today, in addition nitroglycerin therapy initiated.  Jobe exercises at home.  Follow up in 4-6 weeks for further evaluation.

## 2013-09-01 NOTE — Progress Notes (Signed)
Patient ID: Charles Hopkins, male   DOB: 1950/07/25, 63 y.o.   MRN: [id number] 63 yo male with complaint of gradually worsening left shoulder pain.  Onset over past 1-2 months.  Pain with arm flexion, heavy lifting and at night.  No inciting injury.  Radiation into upper arm.  Taking 2 alleve at bedtime with moderate relief.  Difficulty reaching behind back and picking up grandchild.    Had MRI done 2 weeks ago.  Showed diffuse rotator cuff tendinopathy, partial bicep tendon tear and small partial tears of supscapularis and supraspinatus tendons.  PPMH:  Frozen right shoulder, DJD of LS spine  ROS:  As per HPI, otherwise negative.  Examination: BP 132/89  Ht 6\' 3"  (1.905 m)  Wt 206 lb (93.441 kg)  BMI 25.75 kg/m2   Well developed well nourished 63 yo male A&O x3 in NAD  Left Shoulder: Inspection reveals no abnormalities, atrophy or asymmetry. TTP over bicepital groove ROM is full in all planes. Rotator cuff strength normal throughout. Positiive signs of impingement with Neer and Hawkin's tests, empty can. Speeds positive Negative Obrien's Normal scapular function observed. No painful arc and no drop arm sign. No apprehension sign  [facility] Ultrasound of left shoulder:  Partial insertional tear of supraspinatous tendon with calcification.  Partial tear of long head of bicep tendon.  Procedure:  Injection of left subacromial bursa Consent obtained and verified. Time-out conducted. Noted no overlying erythema, induration, or other signs of local infection. Skin prepped in a sterile fashion. Topical analgesic spray: Ethyl chloride. Completed without difficulty. Meds: 1:3 depo/xylocaine Pain immediately improved suggesting accurate placement of the medication. Advised to call if fevers/chills, erythema, induration, drainage, or persistent bleeding.

## 2013-09-01 NOTE — Patient Instructions (Signed)

## 2013-09-14 ENCOUNTER — Other Ambulatory Visit: Payer: BC Managed Care – PPO | Admitting: Sports Medicine

## 2013-10-11 ENCOUNTER — Ambulatory Visit (INDEPENDENT_AMBULATORY_CARE_PROVIDER_SITE_OTHER): Payer: BC Managed Care – PPO | Admitting: Sports Medicine

## 2013-10-11 ENCOUNTER — Encounter: Payer: Self-pay | Admitting: Sports Medicine

## 2013-10-11 VITALS — BP 112/69 | HR 76 | Ht 75.0 in | Wt 205.0 lb

## 2013-10-11 DIAGNOSIS — M6790 Unspecified disorder of synovium and tendon, unspecified site: Secondary | ICD-10-CM

## 2013-10-11 DIAGNOSIS — M67912 Unspecified disorder of synovium and tendon, left shoulder: Secondary | ICD-10-CM

## 2013-10-11 DIAGNOSIS — M719 Bursopathy, unspecified: Secondary | ICD-10-CM

## 2013-10-11 MED ORDER — GABAPENTIN 300 MG PO CAPS: 300.0000 mg | ORAL_CAPSULE | Freq: Four times a day (QID) | ORAL | Status: DC | PRN

## 2013-10-11 NOTE — Progress Notes (Signed)
  Charles Hopkins - 63 y.o. male MRN [id number]  Date of birth: Jul 11, 1950  SUBJECTIVE:  Including CC & ROS.  Patient is a 63 year old male who follows up today for his left shoulder pain. Issue pain started approximately 3 months ago. Patient had significant tenderness over bicipital groove and decreased range of motion. Majority of his pain has been with arm flexion and biceps curls. He would rate radiating from his bicipital groove into this anterior arm. His previously using NSAIDs over-the-counter with moderate relief. Had an MRI previously that showed diffuse rotator cuff tendinopathy and a partial biceps tear with small partial subscapularis and supraspinatus tear. Previous [facility] ultrasound in June showed a partial insertion tear of the supraspinatus calcification and partial tear the long head of the biceps tendon. Patient was treated conservatively with range of motion exercises rotator cuff strengthening exercises, he received a previous subacromial joint injection and also started on nitroglycerin protocol. He follow up today for four-week followup and reports significant improvement in his range of motion, minimal pain in forward flexion and full abduction much improved from previous pain. Pain is worse with overhead activities   ROS: Review of systems otherwise negative except for information present in HPI  HISTORY: Past Medical, Surgical, Social, and Family History Reviewed & Updated per EMR. Pertinent Historical Findings include:  In the past medical history of a right shoulder Capsulitis but never on the left.  DATA REVIEWED: Previous [facility] ultrasound images were reviewed  PHYSICAL EXAM:  VS: BP:112/69 mmHg  HR:76bpm  TEMP: ( )  RESP:   HT:6\' 3"  (190.5 cm)   WT:205 lb (92.987 kg)  BMI:25.7 PHYSICAL EXAM: SHOULDER EXAM:  General: Alert and oriented  Skin: warm; dry, intact, no rashes, lesions, ecchymosis, or erythema Neurologically: Sensation to light touch upper extremities equal and  intact bilaterally.   Palpation: no tenderness over the [facility] joint, mild to moderate tenderness over bicipital grove and supraspinatus tenderness  ROM active/passive: symmetric full 170 degree of abduction and forward flexion on the left compared to the right which is full 180, symmetric internal (80-90) and external rotation (90) with shoudler at 90 abduction. Appley's scratch test equal bilaterlly     Strength testing: 5/5 symmetric strength in internal and external rotation, forward flexion, adduction and abduction     Normal sensation with no sensory or motor defects in C4-C8     Special Test: Positive Neers, positive Hawkins, positive empty can, neg obriens,  positive speeds, neg apprehension  MSK [place]: Evidence of previous partial tear to the long head of the biceps with resolving hypoechoic changes. Partial insertion tear of the supraspinatus hypoechoic changes are resolving.  ASSESSMENT & PLAN: See problem based charting & AVS for pt instructions.

## 2013-10-11 NOTE — Assessment & Plan Note (Signed)
Tendinopathy of the left rotator cuff: -Patient has had clinical improvement and range of motion and strength of the rotator cuff muscles -[facility] ultrasound revealed some gradual resolution of hypoechoic changes within the biceps tendon and supraspinatus tendon. Recommended continued strengthening exercises with progression to biceps exercises maintaining most of activities between 0 and 90 of abduction or forward flexion. -Continue nitroglycerin protocol over the bicipital tendon tear and groove -Followup in 6 weeks

## 2013-10-11 NOTE — Patient Instructions (Signed)
Shoulder exercises: Continue strengthening exercises with 3 sets of 15 repetitions progressing from 3 pounds to 5 pounds in the following directions Flexing up to proximally 90 in front of her body, flexing to approximately 90 and 45 angle tear body, and flexion to approximately 90 parallel to her body. He not do any extension behind back. Work on biceps strengthening with biceps curls and rotating of the forearm starting at 3 pounds and advanced into 5 pounds. Continue therapy and activities for all range of motion.  Plan followup in 6 weeks

## 2013-10-24 ENCOUNTER — Encounter: Payer: Self-pay | Admitting: Sports Medicine

## 2013-11-23 ENCOUNTER — Encounter: Payer: Self-pay | Admitting: Sports Medicine

## 2013-11-23 ENCOUNTER — Ambulatory Visit (INDEPENDENT_AMBULATORY_CARE_PROVIDER_SITE_OTHER): Payer: BC Managed Care – PPO | Admitting: Sports Medicine

## 2013-11-23 VITALS — BP 126/76 | HR 69 | Ht 75.0 in | Wt 205.0 lb

## 2013-11-23 DIAGNOSIS — M719 Bursopathy, unspecified: Secondary | ICD-10-CM | POA: Diagnosis not present

## 2013-11-23 DIAGNOSIS — M6790 Unspecified disorder of synovium and tendon, unspecified site: Secondary | ICD-10-CM | POA: Diagnosis not present

## 2013-11-23 DIAGNOSIS — M67912 Unspecified disorder of synovium and tendon, left shoulder: Secondary | ICD-10-CM

## 2013-11-23 NOTE — Progress Notes (Signed)
   Subjective:    Patient ID: Charles Hopkins, male    DOB: Oct 31, 1950, 63 y.o.   MRN: [id number]  HPI Charles Hopkins is a 63 year old male who presents for followup of left shoulder rotator cuff tendinopathy. More specifically, partial thickness tear of the long head of the biceps tendon and now resolved supraspinatus partial thickness tear. His symptoms are overall significantly improving. He does some pain which occasionally wakes him up at night. Symptoms are aggravated if he lays on his left shoulder. Relieving factors include exercises. He has been doing 5 pound biceps curls and shoulder exercises. He has been using a nitroglycerin patch for the past 12 weeks over the anterior bicipital groove with good relief of symptoms. His range of motion is improving. He is not requiring any additional medications. He denies any numbness, tingling, weakness, or neck pain.  Past medical, social, medications, and allergies were reviewed and are up-to-date in the chart. Review of Systems As per history of present illness, and an 11 point review of systems was performed is otherwise negative.    Objective:   Physical Exam BP 126/76  Pulse 69  Ht 6\' 3"  (1.905 m)  Wt 205 lb (92.987 kg)  BMI 25.62 kg/m2 GEN: The patient is well-developed well-nourished male and in no acute distress.  He is awake alert and oriented x3. SKIN: warm and well-perfused, no rash  EXTR: No lower extremity edema or calf tenderness Neuro: Strength 5/5 globally. Sensation intact throughout. DTRs 2/4 bilaterally. No focal deficits. Vasc: +2 bilateral distal pulses. No edema.  MSK:  Examination of the left shoulder reveals active range of motion testing with forward flexion is from 0-180. Abduction to 180. Abduction and external rotation is to 90. Abducted internal rotation is to 60. Internal rotation and extension is to the level of L1. Provocative testing reveals a slightly positive Yergason's test on the left for pain, but not weakness.  Negative speeds test. Good strength with Jobes test. Negative Hawkins test. No tenderness at the a.c. Joint.  Limited musculoskeletal ultrasound: Long and chart axis views were obtained of the left shoulder. Approximately 50% mid biceps tendon disruption was seen in the long and short axis, though it was definitive that fibers of the biceps tendon were seen confluently throughout. There is mild surrounding edema. No signs of biceps tendon retraction. Examination of the supraspinatus reveals a very small area of disruption over the anterolateral footplate with calcifications, however no tendon tear is seen. Impingement testing under ultrasound is negative for abnormality.     Assessment & Plan:  Please see problem based assessment and plan in the problem list.

## 2013-11-23 NOTE — Assessment & Plan Note (Addendum)
Resolved supraspinatus partial insertional tear. Improving long head of the biceps tendon partial thickness tear with surrounding fluid. -Continue nitroglycerin patch over the anterior biceps tendon for an additional 8 weeks (currently 12 weeks out nitroglycerin). -Increase weight of biceps curls from 5 pounds to 7.5 pounds. Continue that weight for 2 weeks, then increase to 10 pounds. Continue this for 2 weeks, then increase to 15 pounds. To continue this way for 2 weeks, then increase to 20 pounds. -We will see him back in 2 months or sooner if needed.

## 2014-01-19 ENCOUNTER — Encounter: Payer: Self-pay | Admitting: Sports Medicine

## 2014-01-19 ENCOUNTER — Ambulatory Visit: Payer: BC Managed Care – PPO | Admitting: Sports Medicine

## 2014-01-19 ENCOUNTER — Ambulatory Visit (INDEPENDENT_AMBULATORY_CARE_PROVIDER_SITE_OTHER): Payer: BC Managed Care – PPO | Admitting: Sports Medicine

## 2014-01-19 VITALS — BP 130/77 | HR 71 | Ht 75.0 in | Wt 205.0 lb

## 2014-01-19 DIAGNOSIS — M67912 Unspecified disorder of synovium and tendon, left shoulder: Secondary | ICD-10-CM

## 2014-01-19 NOTE — Assessment & Plan Note (Signed)
We will cont with NTG protocol  Cont HEP but add Codman exercises for better mobility of left shoulder  Hx of RT frozen shoulder  Keep weight lifting to present level on left - don't increase  Reck and scan 6 weeks

## 2014-01-19 NOTE — Progress Notes (Signed)
History was provided by the patient.  HPI: Subjective:  Charles Hopkins is a 63 year old male who follows up today in Sports Medicine clinic for 6 month history of left shoulder pain. Overall Charles Hopkins reports that pain, ROM, and strength have improved. He reports persistent pain in anterior arm with arm flexion and bicep curls. He has continued daily treatment with nitroglycerin patches, and 2-3 doses of tramadol daily with significant improvement in symptoms. He also takes neurotin daily for nerve discomfort. He has continued daily exercises with weight training. He reports initially starting with resistance bands and has advanced to 20 lb biceps curls. He reports improvement in ability to perform ADLs (putting on shirt and bathing).   First US showed showed isnertional SST and biceps tendon partial tear June  Physical Exam:  BP 130/77  Pulse 71  Ht 6\' 3"  (1.905 m)  Wt 205 lb (92.987 kg)  BMI 25.62 kg/m2  Facility age limit for growth percentiles is 20 years. No LMP for male patient.    General:   well appearing, alert, sitting upright in chair, well appearing   Skin:   normal, no overlying effusion, or ecchymosis  Extremities:    Palpation: No tenderness over the [facility] joint, mild tenderness with palpation of the bicipital grove. Mild supraspinatus tenderness.    ROM active/passive: RT ABd/elevation is 180 deg and left isl 160 degree of abduction with left upper extremity, right upper extremity . 170 forward flexion on the left compared to the right which is full 180. Symmetric internal (80-90) and external rotation (90) with shoudler at 90 abduction.  Back scratch to L3/ vs T9  Good sstrength on Hawkins, neer and Empty can but some pain  Neuro:  Sensation to light touch intact to bilaterally upper extremities and back   Imaging: Continued improvement of previous partial tear to the long head of the biceps with resolving hypoechoic changes. Partial insertion tear of the supraspinatus  hypoechoic changes have resolved  Assessment/Plan:   See problem based chart    Johnella Moloney, MD/  Reviewed and edited   Ila Mcgill, MD  01/19/2014

## 2014-02-28 ENCOUNTER — Ambulatory Visit (INDEPENDENT_AMBULATORY_CARE_PROVIDER_SITE_OTHER): Payer: BC Managed Care – PPO | Admitting: Sports Medicine

## 2014-02-28 ENCOUNTER — Encounter: Payer: Self-pay | Admitting: Sports Medicine

## 2014-02-28 VITALS — BP 114/77 | HR 85 | Ht 75.0 in | Wt 205.0 lb

## 2014-02-28 DIAGNOSIS — M67912 Unspecified disorder of synovium and tendon, left shoulder: Secondary | ICD-10-CM

## 2014-02-28 DIAGNOSIS — M7501 Adhesive capsulitis of right shoulder: Secondary | ICD-10-CM

## 2014-02-28 MED ORDER — TRAMADOL HCL 50 MG PO TABS: 50.0000 mg | ORAL_TABLET | Freq: Four times a day (QID) | ORAL | Status: DC | PRN

## 2014-02-28 MED ORDER — GABAPENTIN 300 MG PO CAPS: 300.0000 mg | ORAL_CAPSULE | Freq: Four times a day (QID) | ORAL | Status: DC | PRN

## 2014-02-28 NOTE — Assessment & Plan Note (Signed)
Resolved supraspinatus partial insertional tear. Persistent long head of the biceps tendon partial thickness tear with surrounding fluid. -Continue nitroglycerin patch over the anterior biceps tendon for an additional 8 weeks (currently 20 weeks on nitroglycerin). -Increase weight of biceps curls from reps continue 15 lbs weight - Increase Shoulder stretching exercises provided towel stretch and cross arm stretch -We will see him back in 2 months or sooner if needed.

## 2014-02-28 NOTE — Progress Notes (Signed)
  Charles Hopkins - 63 y.o. male MRN [id number]  Date of birth: Aug 05, 1950  SUBJECTIVE:  Including CC & ROS.  Patient is a 63 year old dominant falling up today on a 6 month history of left shoulder pain diagnosed with adhesive capsulitis and rotator cuff tendinitis first seen in June 2015. At his initial visit patient was found to have a partial tear to the supraspinatus tendon with calcification and partial tear to the long head of the biceps tendon with hypoechoic fluid around the biceps. Patient was treated with a subacromial injection, started on NTG protocol and HEP. Patient has subsequently on nitroglycerin for the past 6 months and his ultrasound has shown interval changes with resolution of supraspinatus partial tear and only minimal improvement and biceps tendon tear. Patient denies any significant pain or discomfort his biggest concern is his lack of motion. He has a history of frozen shoulder and his right and currently has had some decreased range of motion left but no loss of strength. Is working on home exercise programs and include exercises for frozen shoulder codman exercises   ROS: Review of systems otherwise negative except for information present in HPI  HISTORY: Past Medical, Surgical, Social, and Family History Reviewed & Updated per EMR. Pertinent Historical Findings include: HLD, GERD, Nonsmoker  DATA REVIEWED: Previous US reviewed  PHYSICAL EXAM:  VS: BP:114/77 mmHg  HR:85bpm  TEMP: ( )  RESP:   HT:6\' 3"  (190.5 cm)   WT:205 lb (92.987 kg)  BMI:25.7 Left SHOULDER EXAM:  General: well nourished Skin of UE: warm; dry, no rashes, lesions, ecchymosis or erythema. Vascular: radial pulses 2+ bilaterally Neurologically: Normal sensation with no sensory or motor defects in C4-C8, bilateral Palpation: no tenderness over the [facility] joint, acromion, no bicipital grove tenderness, no supraspinatus tenderness ROM active/passive: Left 160 flex and 170 abduction, right180 degree of  abduction and forward flexion, symmetric internal (80-90) and left external rotation 10-15 deg with shoulder at 90 abduction and right is at 30 deg.  Appley's scratch test Left at L3/ Right T10 Strength testing: 5/5 symmetric strength in internal and external rotation, forward flexion, adduction and abduction     Special Test: Negative Neer's, neg Hawkins, negative empty can, neg O'Brien, neg     speeds, neg apprehension  MSK [place]: Interval improvement in healing of supraspinatus at the anterior lateral footplate with persistent calcification. Biceps tendon isn't continues to have hypoechoic changes with fluid around the biceps both in the long and short axis views  ASSESSMENT & PLAN: See problem based charting & AVS for pt instructions.

## 2014-04-27 ENCOUNTER — Other Ambulatory Visit: Payer: Self-pay | Admitting: Internal Medicine

## 2014-04-27 ENCOUNTER — Ambulatory Visit (INDEPENDENT_AMBULATORY_CARE_PROVIDER_SITE_OTHER): Payer: 59 | Admitting: Internal Medicine

## 2014-04-27 ENCOUNTER — Other Ambulatory Visit (INDEPENDENT_AMBULATORY_CARE_PROVIDER_SITE_OTHER): Payer: 59

## 2014-04-27 ENCOUNTER — Encounter: Payer: Self-pay | Admitting: Internal Medicine

## 2014-04-27 ENCOUNTER — Ambulatory Visit: Payer: BC Managed Care – PPO | Admitting: Sports Medicine

## 2014-04-27 VITALS — BP 130/82 | HR 70 | Temp 97.8°F | Resp 14 | Wt 211.5 lb

## 2014-04-27 DIAGNOSIS — G609 Hereditary and idiopathic neuropathy, unspecified: Secondary | ICD-10-CM

## 2014-04-27 DIAGNOSIS — M67912 Unspecified disorder of synovium and tendon, left shoulder: Secondary | ICD-10-CM

## 2014-04-27 DIAGNOSIS — Z0189 Encounter for other specified special examinations: Secondary | ICD-10-CM

## 2014-04-27 DIAGNOSIS — Z0000 Encounter for general adult medical examination without abnormal findings: Secondary | ICD-10-CM

## 2014-04-27 DIAGNOSIS — Z86010 Personal history of colonic polyps: Secondary | ICD-10-CM

## 2014-04-27 DIAGNOSIS — E785 Hyperlipidemia, unspecified: Secondary | ICD-10-CM

## 2014-04-27 DIAGNOSIS — J0100 Acute maxillary sinusitis, unspecified: Secondary | ICD-10-CM

## 2014-04-27 LAB — CBC WITH DIFFERENTIAL/PLATELET
Basophils Absolute: 0 10*3/uL (ref 0.0–0.1)
Basophils Relative: 0.4 % (ref 0.0–3.0)
Eosinophils Absolute: 0.1 10*3/uL (ref 0.0–0.7)
Eosinophils Relative: 1.7 % (ref 0.0–5.0)
HCT: 44.4 % (ref 39.0–52.0)
Hemoglobin: 14.8 g/dL (ref 13.0–17.0)
Lymphocytes Relative: 21.8 % (ref 12.0–46.0)
Lymphs Abs: 1.6 10*3/uL (ref 0.7–4.0)
MCHC: 33.3 g/dL (ref 30.0–36.0)
MCV: 87.2 fl (ref 78.0–100.0)
Monocytes Absolute: 0.6 10*3/uL (ref 0.1–1.0)
Monocytes Relative: 8.5 % (ref 3.0–12.0)
Neutro Abs: 4.8 10*3/uL (ref 1.4–7.7)
Neutrophils Relative %: 67.6 % (ref 43.0–77.0)
Platelets: 318 10*3/uL (ref 150.0–400.0)
RBC: 5.09 Mil/uL (ref 4.22–5.81)
RDW: 14 % (ref 11.5–15.5)
WBC: 7.1 10*3/uL (ref 4.0–10.5)

## 2014-04-27 LAB — HEPATIC FUNCTION PANEL
ALT: 18 U/L (ref 0–53)
AST: 22 U/L (ref 0–37)
Albumin: 4.4 g/dL (ref 3.5–5.2)
Alkaline Phosphatase: 72 U/L (ref 39–117)
Bilirubin, Direct: 0.1 mg/dL (ref 0.0–0.3)
Total Bilirubin: 0.4 mg/dL (ref 0.2–1.2)
Total Protein: 7 g/dL (ref 6.0–8.3)

## 2014-04-27 LAB — BASIC METABOLIC PANEL
BUN: 12 mg/dL (ref 6–23)
CO2: 29 mEq/L (ref 19–32)
Calcium: 9.7 mg/dL (ref 8.4–10.5)
Chloride: 104 mEq/L (ref 96–112)
Creatinine, Ser: 0.88 mg/dL (ref 0.40–1.50)
GFR: 92.87 mL/min (ref >60.00)
Glucose, Bld: 96 mg/dL (ref 70–99)
Potassium: 4.6 mEq/L (ref 3.5–5.1)
Sodium: 140 mEq/L (ref 135–145)

## 2014-04-27 LAB — PSA: PSA: 0.54 ng/mL (ref 0.10–4.00)

## 2014-04-27 LAB — TSH: TSH: 4.75 u[IU]/mL — ABNORMAL HIGH (ref 0.35–4.50)

## 2014-04-27 MED ORDER — CLARITHROMYCIN ER 500 MG PO TB24: 1000.0000 mg | ORAL_TABLET | Freq: Every day | ORAL | Status: AC

## 2014-04-27 NOTE — Patient Instructions (Signed)
Your next office appointment will be determined based upon review of your pending labs . Those instructions will be transmitted to you  by mail Critical values will be called   Plain Mucinex (NOT D) for thick secretions ;force NON dairy fluids .   Nasal cleansing in the shower as discussed with lather of mild shampoo.After 10 seconds wash off lather while  exhaling through nostrils. Make sure that all residual soap is removed to prevent irritation.  Flonase OR Nasacort AQ 1 spray in each nostril twice a day as needed. Use the "crossover" technique into opposite nostril spraying toward opposite ear @ 45 degree angle, not straight up into nostril.  Plain Allegra (NOT D )  160 daily , Loratidine 10 mg , OR Zyrtec 10 mg @ bedtime  as needed for itchy eyes & sneezing.

## 2014-04-27 NOTE — Progress Notes (Signed)
   Subjective:    Patient ID: Charles Hopkins, male    DOB: 1950/11/03, 64 y.o.   MRN: [id number]  HPI  He is here for a physical;acute issues include tendinopathy of the left rotator cuff. He's been working with Dr. Oneida Alar, sports medicine specialist with dramatic improvement in symptoms.  Last colonoscopy was 2008; he had hyperplastic polyps. He has no active GI symptoms.   Review of Systems   Mcbain nasal discharge, sinus pain & cough have been present X 3 days w/o fever. 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  Unexplained weight loss, abdominal pain, significant dyspepsia, dysphagia, melena, rectal bleeding, or persistently small caliber stools are denied.      Objective:   Physical Exam  Gen.: Adequately nourished in appearance. Alert, appropriate and cooperative throughout exam.  Head: Normocephalic without obvious abnormalities. Goatee; no alopecia  Eyes: No corneal or conjunctival inflammation noted. Pupils equal round reactive to light and accommodation. Extraocular motion intact.  Ears: External  ear exam reveals no significant lesions or deformities. Canals clear .TMs normal. Hearing is grossly normal bilaterally. Nose: External nasal exam reveals no deformity or inflammation. Nasal mucosa are pink and moist. No lesions or exudates noted.   Mouth: Oral mucosa and oropharynx reveal no lesions or exudates. Teeth in good repair. Neck: No deformities, masses, or tenderness noted. Range of motion & Thyroid normal.. Lungs: Normal respiratory effort; chest expands symmetrically. Lungs are clear to auscultation without rales, wheezes, or increased work of breathing. Heart: Normal rate and rhythm. Normal S1 and S2. No gallop, click, or rub. No murmur. Abdomen: Bowel sounds normal; abdomen soft and nontender. No masses, organomegaly or hernias noted. Genitalia:  Genitalia normal except for right granuloma & left varices. Deferred due to history of pain with DREs. PSA always < 1.00.                                Musculoskeletal/extremities: No deformity or scoliosis noted of  the thoracic or lumbar spine.  No clubbing, cyanosis, edema, or significant extremity  deformity noted.  Range of motion normal . Tone & strength normal. Hand joints normal..  Fingernail  health good. Crepitus of knees ; some valgus changes Able to lie down & sit up w/o help.  Negative SLR bilaterally Vascular: Carotid, radial artery, dorsalis pedis and  posterior tibial pulses are full and equal. No bruits present. Neurologic: Alert and oriented x3. Deep tendon reflexes symmetrical and normal.  Gait normal      Skin: Intact without suspicious lesions or rashes. Lymph: No cervical, axillary, or inguinal lymphadenopathy present. Psych: Mood and affect are normal. Normally interactive                                                                                      Assessment & Plan:  #1 comprehensive physical exam; no acute findings  Plan: see Orders  & Recommendations

## 2014-04-27 NOTE — Progress Notes (Signed)
Pre visit review using our clinic review tool, if applicable. No additional management support is needed unless otherwise documented below in the visit note. 

## 2014-04-29 LAB — NMR LIPOPROFILE WITH LIPIDS
Cholesterol, Total: 221 mg/dL — ABNORMAL HIGH (ref 100–199)
HDL Particle Number: 29.4 umol/L — ABNORMAL LOW (ref >=30.5)
HDL Size: 9.6 nm (ref >=9.2)
HDL-C: 60 mg/dL (ref >39)
LDL (calc): 147 mg/dL — ABNORMAL HIGH (ref 0–99)
LDL Particle Number: 1379 nmol/L — ABNORMAL HIGH (ref <1000)
LDL Size: 21.3 nm (ref >=20.8)
LP-IR Score: <25 (ref <=45)
Large HDL-P: 8.8 umol/L (ref >=4.8)
Large VLDL-P: 1.4 nmol/L (ref <=2.7)
Small LDL Particle Number: 420 nmol/L (ref <=527)
Triglycerides: 69 mg/dL (ref 0–149)
VLDL Size: 38.1 nm (ref <=46.6)

## 2014-04-30 ENCOUNTER — Other Ambulatory Visit: Payer: Self-pay | Admitting: Internal Medicine

## 2014-04-30 DIAGNOSIS — R946 Abnormal results of thyroid function studies: Secondary | ICD-10-CM | POA: Insufficient documentation

## 2014-05-03 ENCOUNTER — Ambulatory Visit: Payer: Self-pay | Admitting: Sports Medicine

## 2014-05-30 ENCOUNTER — Ambulatory Visit (INDEPENDENT_AMBULATORY_CARE_PROVIDER_SITE_OTHER): Payer: 59 | Admitting: Sports Medicine

## 2014-05-30 VITALS — BP 146/79

## 2014-05-30 DIAGNOSIS — M7501 Adhesive capsulitis of right shoulder: Secondary | ICD-10-CM

## 2014-05-30 DIAGNOSIS — M67912 Unspecified disorder of synovium and tendon, left shoulder: Secondary | ICD-10-CM

## 2014-05-30 MED ORDER — TRAMADOL HCL 50 MG PO TABS: 50.0000 mg | ORAL_TABLET | Freq: Four times a day (QID) | ORAL | Status: DC | PRN

## 2014-05-30 MED ORDER — GABAPENTIN 300 MG PO CAPS: 300.0000 mg | ORAL_CAPSULE | Freq: Three times a day (TID) | ORAL | Status: DC

## 2014-05-30 NOTE — Patient Instructions (Signed)
Refilled his tramadol and gabapentin  Pt using theraband for shoulder exercises, continue with routine as before  Follow up as needed

## 2014-05-30 NOTE — Progress Notes (Signed)
Patient ID: Charles Hopkins, male   DOB: 09/05/1950, 64 y.o.   MRN: [id number]  BP 146/79 mmHg  F/u of left shoulder Still performing exercises working on the left bicep Been using tramadol, 4 pills a day.  Also taking Aleve Pain level is minimal now  HX of frozen shoulder rt and had early changes of froz should left but motion is improving Has lowered weights for BT left as we suggested ad increased motion exercise  Exam: NAD  Left shoulder  Full forward flexion Full abduction Good internal and external rotation Full shoulder strength good in all planes   Lacking 5 deg of  Of ER in frozen shoulder position Back scratch is limited bilaterally but more on left

## 2014-05-30 NOTE — Assessment & Plan Note (Signed)
WE have continued tramadol  Suggested use on TID basis Less irritation than noted with NSAIDs  Work ROM as this is better  Keep strength work at low weight  I want to repeat scan in 2 to 3 mos but clinically he seems to be 90% improved

## 2014-10-30 ENCOUNTER — Other Ambulatory Visit: Payer: Self-pay | Admitting: *Deleted

## 2014-10-30 DIAGNOSIS — M7501 Adhesive capsulitis of right shoulder: Secondary | ICD-10-CM

## 2014-10-30 MED ORDER — GABAPENTIN 300 MG PO CAPS: 300.0000 mg | ORAL_CAPSULE | Freq: Three times a day (TID) | ORAL | Status: DC

## 2015-01-17 ENCOUNTER — Other Ambulatory Visit: Payer: Self-pay | Admitting: *Deleted

## 2015-01-17 DIAGNOSIS — M7501 Adhesive capsulitis of right shoulder: Secondary | ICD-10-CM

## 2015-01-17 MED ORDER — TRAMADOL HCL 50 MG PO TABS: 50.0000 mg | ORAL_TABLET | Freq: Four times a day (QID) | ORAL | Status: DC | PRN

## 2015-02-21 ENCOUNTER — Ambulatory Visit: Payer: 59 | Admitting: Sports Medicine

## 2016-03-04 ENCOUNTER — Other Ambulatory Visit: Payer: Self-pay | Admitting: *Deleted

## 2016-03-04 DIAGNOSIS — M7501 Adhesive capsulitis of right shoulder: Secondary | ICD-10-CM

## 2016-03-04 MED ORDER — GABAPENTIN 300 MG PO CAPS: 300.0000 mg | ORAL_CAPSULE | Freq: Three times a day (TID) | ORAL | 2 refills | Status: DC

## 2016-04-02 ENCOUNTER — Telehealth: Payer: Self-pay | Admitting: *Deleted

## 2016-04-02 ENCOUNTER — Other Ambulatory Visit: Payer: Self-pay | Admitting: *Deleted

## 2016-04-02 DIAGNOSIS — M7501 Adhesive capsulitis of right shoulder: Secondary | ICD-10-CM

## 2016-04-02 MED ORDER — GABAPENTIN 300 MG PO CAPS: 300.0000 mg | ORAL_CAPSULE | Freq: Three times a day (TID) | ORAL | 2 refills | Status: DC

## 2016-04-02 NOTE — Telephone Encounter (Signed)
-----   Message from Carolyne Littles sent at 04/02/2016  9:35 AM EST ----- Regarding: refill request per patient Asking for a gabapentin refill- pt didn't pick up rx sent in on 12/17 due to insurance didn't cover it. New insurance new pharmacy send to [facility] on [address] in [place]

## 2016-04-03 ENCOUNTER — Encounter: Payer: 59 | Admitting: Sports Medicine

## 2016-04-14 ENCOUNTER — Encounter: Payer: Self-pay | Admitting: Gastroenterology

## 2016-05-01 ENCOUNTER — Encounter: Payer: Self-pay | Admitting: Sports Medicine

## 2016-05-01 ENCOUNTER — Ambulatory Visit (INDEPENDENT_AMBULATORY_CARE_PROVIDER_SITE_OTHER): Payer: Medicare Other | Admitting: Sports Medicine

## 2016-05-01 ENCOUNTER — Other Ambulatory Visit: Payer: Self-pay | Admitting: *Deleted

## 2016-05-01 DIAGNOSIS — Q667 Congenital pes cavus, unspecified foot: Secondary | ICD-10-CM

## 2016-05-01 DIAGNOSIS — M7501 Adhesive capsulitis of right shoulder: Secondary | ICD-10-CM | POA: Diagnosis present

## 2016-05-01 DIAGNOSIS — G609 Hereditary and idiopathic neuropathy, unspecified: Secondary | ICD-10-CM

## 2016-05-01 MED ORDER — TRAMADOL HCL 50 MG PO TABS: 50.0000 mg | ORAL_TABLET | Freq: Four times a day (QID) | ORAL | 4 refills | Status: DC | PRN

## 2016-05-01 NOTE — Assessment & Plan Note (Signed)
This has led to hypersensitivity in feet  Orthotics clearly seem to help  Will also continue his gabapentin  I reordered his tramadol which he used for this and lumbar pain

## 2016-05-01 NOTE — Progress Notes (Signed)
CC: Bilateral foot pain  Patient has hx of peripheral neuropathy Causes cold hands and feet He had persistent foot pain with walking until we put him in orthotics Since then he can walk for normal activity with 0/10 pain For exercise he stays on elliptical and that does not cause foot pain  Prob 2 Left rotator cuff injury He feels that this has returned to 100% function Careful with weight level for lifting  ROS No ulcers on feet No swelling of feet  Exam Gen fit appearing M in NAD BP 136/69   Pulse 65   Ht 6\' 3"  (1.905 m)   Wt 205 lb (93 kg)   BMI 25.62 kg/m  Left shoulder and right shoulder show full ROM Strength testing for RC is normal Empty can is normal and strong bilat  Feet show moderate arches longitudinal bilat/ cavus type No transverse arch breakdown Skin is cool Sensation fells like burning to touch No TTP  Patient was fitted for a : standard, cushioned, semi-rigid orthotic. The orthotic was heated and afterward the patient stood on the orthotic blank positioned on the orthotic stand. The patient was positioned in subtalar neutral position and 10 degrees of ankle dorsiflexion in a weight bearing stance. After completion of molding, a stable base was applied to the orthotic blank. The blank was ground to a stable position for weight bearing. Size: 12 red EVA Base: blue med. EVA Posting: none Additional orthotic padding: none  Post orthotic gait was neutral and appears pain free Comfortable to patient

## 2016-05-01 NOTE — Assessment & Plan Note (Signed)
We will cont long term orthotics Good EBM for cavus foot No longer any heel pain  Reck prn

## 2018-02-04 ENCOUNTER — Encounter: Payer: Self-pay | Admitting: Sports Medicine

## 2018-02-04 ENCOUNTER — Encounter

## 2018-02-04 ENCOUNTER — Ambulatory Visit (INDEPENDENT_AMBULATORY_CARE_PROVIDER_SITE_OTHER): Payer: Medicare Other | Admitting: Sports Medicine

## 2018-02-04 VITALS — BP 128/78 | Ht 75.0 in | Wt 195.0 lb

## 2018-02-04 DIAGNOSIS — M25775 Osteophyte, left foot: Secondary | ICD-10-CM

## 2018-02-04 DIAGNOSIS — Q6672 Congenital pes cavus, left foot: Secondary | ICD-10-CM | POA: Diagnosis present

## 2018-02-04 DIAGNOSIS — Q6671 Congenital pes cavus, right foot: Secondary | ICD-10-CM | POA: Diagnosis not present

## 2018-02-04 NOTE — Progress Notes (Signed)
  Torry Istre - 67 y.o. male MRN [id number]  Date of birth: 12-22-1950    SUBJECTIVE:      Chief Complaint:/ HPI:  67 year old male presents for orthotics.  He has been wearing custom orthotics for many years and reports good control of his midfoot pain.  He does report having some pain over the first MTP of the left foot.  This is been present for about 2 weeks and developed following some significant outdoor walking.  Denies any redness or erythema.   ROS:     See HPI  PERTINENT  PMH / PSH FH / / SH:  Past Medical, Surgical, Social, and Family History Reviewed & Updated in the EMR.    OBJECTIVE: BP 128/78   Ht 6\' 3"  (1.905 m)   Wt 195 lb (88.5 kg)   BMI 24.37 kg/m   Physical Exam:  Vital signs are reviewed.  GEN: Alert and oriented, NAD Pulm: Breathing unlabored PSY: normal mood, congruent affect  MSK: Left foot: Inspection: First TMT bossing.  No erythema or bruising.  Mild pes cavus Palpation: Tenderness circumferentially around the first MTP TMT bossing of MTT 1 and 2 ROM: Full  ROM of the ankle. Normal midfoot flexibility Strength: 5/5 strength ankle in all planes Neurovascular: N/V intact distally in the lower extremity  Right foot: No obvious deformity.  No erythema or bruising mild pes cavus No tenderness to palpation Full range of motion 5/5 strength N/V intact   ASSESSMENT & PLAN:  1.  First TMT bossing with MTP tenderness.  Will address this by adding a first ray post to his orthotics  Patient was fitted for a : standard, cushioned, semi-rigid orthotic. The orthotic was heated and afterward the patient stood on the orthotic blank positioned on the orthotic stand. The patient was positioned in subtalar neutral position and 10 degrees of ankle dorsiflexion in a weight bearing stance. After completion of molding, a stable base was applied to the orthotic blank. The blank was ground to a stable position for weight bearing. Size: 13 Base: Blue EVA Posting:  First ray post added to left orthotic Additional orthotic padding: None  Total time spent with the patient was 25 minutes with greater than 50% of the time spent in face-to-face consultation discussing orthotic construction, instruction, and sitting. Gait was neutral with orthotics in place. Patient found them to be comfortable. Follow-up as needed.

## 2018-02-17 ENCOUNTER — Other Ambulatory Visit: Payer: Self-pay | Admitting: *Deleted

## 2018-02-17 DIAGNOSIS — M7501 Adhesive capsulitis of right shoulder: Secondary | ICD-10-CM

## 2018-02-17 MED ORDER — GABAPENTIN 300 MG PO CAPS: 300.0000 mg | ORAL_CAPSULE | Freq: Three times a day (TID) | ORAL | 0 refills | Status: DC

## 2018-02-17 MED ORDER — TRAMADOL HCL 50 MG PO TABS: 50.0000 mg | ORAL_TABLET | Freq: Four times a day (QID) | ORAL | 0 refills | Status: DC | PRN

## 2019-02-03 ENCOUNTER — Other Ambulatory Visit: Payer: Self-pay

## 2019-02-03 ENCOUNTER — Ambulatory Visit: Payer: Medicare Other | Admitting: Sports Medicine

## 2019-02-03 VITALS — BP 122/72 | Ht 75.0 in | Wt 194.0 lb

## 2019-02-03 DIAGNOSIS — Q667 Congenital pes cavus, unspecified foot: Secondary | ICD-10-CM

## 2019-02-03 NOTE — Progress Notes (Signed)
  Charles Hopkins - 68 y.o. male MRN [id number]  Date of birth: [birth year]  SUBJECTIVE:   CC: orthotics  68 yo gentleman presenting for orthotics. He was worn custom orthotics for many years and has had good control of midfoot pain.   ROS: No swelling, instability, muscle pain, numbness/tingling, redness, otherwise see HPI   PMHx - Updated and reviewed.  Contributory factors include: Negative PSHx - Updated and reviewed.  Contributory factors include:  Negative FHx - Updated and reviewed.  Contributory factors include:  Negative Social Hx - Updated and reviewed. Contributory factors include: Negative Medications - reviewed   DATA REVIEWED: Prior records  PHYSICAL EXAM:  VS: BP:122/72  HR: bpm  TEMP: ( )  RESP:   HT:6\' 3"  (190.5 cm)   WT:194 lb (88 kg)  BMI:24.25 PHYSICAL EXAM: Gen: NAD, alert, cooperative with exam, well-appearing HEENT: clear conjunctiva,  CV:  no edema, capillary refill brisk, normal rate Resp: non-labored Skin: no rashes, normal turgor  Neuro: no gross deficits.  Psych:  alert and oriented  MSK: Left foot:  Inspection: 1st TMT bossing. Pes cavus noted No TTP Full ROM of ankle 5/5 strength all planes NVI   Right foot: Pes cavus No TTP Full ROM 5/5 strength NVI  ASSESSMENT & PLAN:   TALIPES CAVUS Patient was fitted for a : standard, cushioned, semi-rigid orthotic. The orthotic was heated and afterward the patient stood on the orthotic blank positioned on the orthotic stand. The patient was positioned in subtalar neutral position and 10 degrees of ankle dorsiflexion in a weight bearing stance. After completion of molding, a stable base was applied to the orthotic blank. The blank was ground to a stable position for weight bearing. Size: 13 Base: blue EVA Posting: 1st ray post Additional orthotic padding: none  Gait was neutral with orthotics in place. Patient found them to be comfortable. Follow-up as needed.  I observed and examined the  patient with Dr. Saunders Revel and agree with assessment and plan.  Note reviewed and modified by me. Ila Mcgill, MD

## 2019-02-03 NOTE — Assessment & Plan Note (Signed)
Patient was fitted for a : standard, cushioned, semi-rigid orthotic. The orthotic was heated and afterward the patient stood on the orthotic blank positioned on the orthotic stand. The patient was positioned in subtalar neutral position and 10 degrees of ankle dorsiflexion in a weight bearing stance. After completion of molding, a stable base was applied to the orthotic blank. The blank was ground to a stable position for weight bearing. Size: 13 Base: blue EVA Posting: 1st ray post Additional orthotic padding: none  Gait was neutral with orthotics in place. Patient found them to be comfortable. Follow-up as needed.

## 2019-03-08 ENCOUNTER — Telehealth: Payer: Self-pay

## 2019-03-08 DIAGNOSIS — M7501 Adhesive capsulitis of right shoulder: Secondary | ICD-10-CM

## 2019-03-08 MED ORDER — GABAPENTIN 300 MG PO CAPS: 300.0000 mg | ORAL_CAPSULE | Freq: Three times a day (TID) | ORAL | 0 refills | Status: DC

## 2019-03-08 MED ORDER — TRAMADOL HCL 50 MG PO TABS: 50.0000 mg | ORAL_TABLET | Freq: Four times a day (QID) | ORAL | 0 refills | Status: DC | PRN

## 2019-03-08 NOTE — Telephone Encounter (Signed)
Scripts faxed to new pharmacy. Pt is aware.

## 2019-03-09 ENCOUNTER — Other Ambulatory Visit: Payer: Self-pay | Admitting: Sports Medicine

## 2019-03-09 DIAGNOSIS — M7501 Adhesive capsulitis of right shoulder: Secondary | ICD-10-CM

## 2019-03-10 ENCOUNTER — Other Ambulatory Visit: Payer: Self-pay

## 2019-03-10 ENCOUNTER — Telehealth: Payer: Self-pay

## 2019-03-10 DIAGNOSIS — M7501 Adhesive capsulitis of right shoulder: Secondary | ICD-10-CM

## 2019-03-10 MED ORDER — TRAMADOL HCL 50 MG PO TABS: 50.0000 mg | ORAL_TABLET | Freq: Four times a day (QID) | ORAL | 2 refills | Status: DC

## 2019-03-10 MED ORDER — GABAPENTIN 300 MG PO CAPS: 300.0000 mg | ORAL_CAPSULE | Freq: Three times a day (TID) | ORAL | 2 refills | Status: DC

## 2019-03-10 NOTE — Telephone Encounter (Signed)
Told pt I would update his scripts so insurance would pay for them but that I would need to check with Dr. Oneida Alar about adding refills. Pt understands.

## 2019-08-02 ENCOUNTER — Other Ambulatory Visit: Payer: Self-pay | Admitting: Sports Medicine

## 2019-08-02 DIAGNOSIS — M7501 Adhesive capsulitis of right shoulder: Secondary | ICD-10-CM

## 2019-08-02 MED ORDER — TRAMADOL HCL 50 MG PO TABS: 50.0000 mg | ORAL_TABLET | Freq: Four times a day (QID) | ORAL | 2 refills | Status: DC

## 2019-12-13 ENCOUNTER — Other Ambulatory Visit: Payer: Self-pay | Admitting: Sports Medicine

## 2019-12-13 DIAGNOSIS — M7501 Adhesive capsulitis of right shoulder: Secondary | ICD-10-CM

## 2019-12-13 MED ORDER — TRAMADOL HCL 50 MG PO TABS: 50.0000 mg | ORAL_TABLET | Freq: Four times a day (QID) | ORAL | 2 refills | Status: DC

## 2020-02-07 ENCOUNTER — Ambulatory Visit: Payer: Self-pay

## 2020-02-07 ENCOUNTER — Ambulatory Visit: Payer: PRIVATE HEALTH INSURANCE | Admitting: Sports Medicine

## 2020-02-07 ENCOUNTER — Other Ambulatory Visit: Payer: Self-pay

## 2020-02-07 ENCOUNTER — Ambulatory Visit: Payer: Medicare Other | Admitting: Sports Medicine

## 2020-02-07 ENCOUNTER — Encounter: Payer: PRIVATE HEALTH INSURANCE | Admitting: Sports Medicine

## 2020-02-07 ENCOUNTER — Encounter: Payer: Self-pay | Admitting: Sports Medicine

## 2020-02-07 VITALS — BP 134/80 | Ht 75.0 in | Wt 190.0 lb

## 2020-02-07 DIAGNOSIS — M25511 Pain in right shoulder: Secondary | ICD-10-CM | POA: Diagnosis not present

## 2020-02-07 DIAGNOSIS — M7551 Bursitis of right shoulder: Secondary | ICD-10-CM | POA: Diagnosis not present

## 2020-02-07 MED ORDER — NITROGLYCERIN 0.2 MG/HR TD PT24
MEDICATED_PATCH | TRANSDERMAL | 2 refills | Status: DC
Start: 2020-02-07 — End: 2020-02-07

## 2020-02-07 MED ORDER — PREDNISONE 20 MG PO TABS: 20.0000 mg | ORAL_TABLET | Freq: Two times a day (BID) | ORAL | 0 refills | Status: AC

## 2020-02-07 MED ORDER — NITROGLYCERIN 0.2 MG/HR TD PT24: MEDICATED_PATCH | TRANSDERMAL | 2 refills | Status: AC

## 2020-02-07 NOTE — Assessment & Plan Note (Signed)
Given clinical history, exam, and ultrasound findings today patient has bursitis of the right shoulder. -Prescribed prednisone 20 mg twice daily for the next 7 days -Once that has resolved patient can start nitro patches first placing it more on the anterior portion of the shoulder and then alternating to the posterior shoulder each day. -Patient given band and exercises to do for rotator cuff tendinitis/bursitis as he did have weakness on testing the supraspinatus -Follow-up in 5 weeks

## 2020-02-07 NOTE — Patient Instructions (Addendum)
It was great to meet you today! Thank you for letting me participate in your care!  Today, we discussed your right shoulder pain which is due to bursitis or inflammation and swelling in the shoulder. Please take the prednisone as prescribed and then start the nitro patches after you have finished the prednisone. Place the patch on the front of your shoulder for day one, then on day two place it on the back. Then continue alternating where you place the patch.   Nitroglycerin Protocol   Apply 1/4 nitroglycerin patch to affected area daily.  Change position of patch within the affected area every 24 hours.  You may experience a headache during the first 1-2 weeks of using the patch, these should subside.  If you experience headaches after beginning nitroglycerin patch treatment, you may take your preferred over the counter pain reliever.  Another side effect of the nitroglycerin patch is skin irritation or rash related to patch adhesive.  Please notify our office if you develop more severe headaches or rash, and stop the patch.  Tendon healing with nitroglycerin patch may require 12 to 24 weeks depending on the extent of injury.  Men should not use if taking Viagra, Cialis, or Levitra.   Do not use if you have migraines or rosacea.    You can start on the exercises right away do them at least 1-2 times each 3 times per week.  We will see you back in 5 weeks to ensure you are getting better.  Be well, Harolyn Rutherford, DO PGY-4, Sports Medicine Fellow [place]
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
## 2020-02-21 ENCOUNTER — Encounter: Payer: PRIVATE HEALTH INSURANCE | Admitting: Sports Medicine

## 2020-02-21 ENCOUNTER — Other Ambulatory Visit: Payer: Self-pay

## 2020-02-21 ENCOUNTER — Ambulatory Visit (INDEPENDENT_AMBULATORY_CARE_PROVIDER_SITE_OTHER): Payer: Medicare Other | Admitting: Sports Medicine

## 2020-02-21 DIAGNOSIS — M7501 Adhesive capsulitis of right shoulder: Secondary | ICD-10-CM | POA: Diagnosis not present

## 2020-02-21 MED ORDER — GABAPENTIN 300 MG PO CAPS: 300.0000 mg | ORAL_CAPSULE | Freq: Three times a day (TID) | ORAL | 4 refills | Status: DC

## 2020-02-21 NOTE — Progress Notes (Signed)
CC; New orthotics for foot pain  Patient is in custom orthotics Can't really walk or stand long without them These were made for: Cavus feet Heel Pain Metatarsalgia  He has an element of peripheral neuropathy that may make the foot pain somewhat worse  Current orthotics worn so comes for new pair.  They had been controlling pain well.  Patient was fitted for a : standard, cushioned, semi-rigid orthotic. The orthotic was heated and afterward the patient stood on the orthotic blank positioned on the orthotic stand. The patient was positioned in subtalar neutral position and 10 degrees of ankle dorsiflexion in a weight bearing stance. After completion of molding, a stable base was applied to the orthotic blank. The blank was ground to a stable position for weight bearing. Size: 13 Base: Blue EVA moderate density Posting: none Additional orthotic padding: none  After completion he felt good comfort.  Gait was neutral  Past history update: Just hospitalized for a GI bleed  Feeling better on Protonix Had imaging but no EGD Possible source Naprosyn?

## 2020-04-03 ENCOUNTER — Ambulatory Visit: Payer: Medicare Other | Admitting: Sports Medicine

## 2020-04-17 ENCOUNTER — Other Ambulatory Visit: Payer: Self-pay | Admitting: Sports Medicine

## 2020-04-17 DIAGNOSIS — M7501 Adhesive capsulitis of right shoulder: Secondary | ICD-10-CM

## 2020-04-17 MED ORDER — TRAMADOL HCL 50 MG PO TABS: 50.0000 mg | ORAL_TABLET | Freq: Four times a day (QID) | ORAL | 2 refills | Status: DC

## 2020-04-24 ENCOUNTER — Other Ambulatory Visit: Payer: Self-pay

## 2020-04-24 ENCOUNTER — Ambulatory Visit: Payer: Medicare Other | Admitting: Sports Medicine

## 2020-04-24 VITALS — BP 136/68 | Ht 75.0 in | Wt 190.0 lb

## 2020-04-24 DIAGNOSIS — M7551 Bursitis of right shoulder: Secondary | ICD-10-CM

## 2020-04-24 NOTE — Assessment & Plan Note (Signed)
Improved pain and range of motion is doing well.  - Cont nitro patches - Start to wean off tramadol - Adding additional front and lateral raises exercises with light weight - F/u in 6 weeks

## 2020-04-24 NOTE — Progress Notes (Signed)
    SUBJECTIVE:   CHIEF COMPLAINT / HPI:   Right Shoulder Pain Charles Hopkins is a very pleasant 70y/o male who presents today for follow up for right shoulder pain that we suspected was due to right shoulder bursitis. We took oral prednisone and states he felt much better. He has been doing the band exercises and states his pain and range of motion is much improved. He is also using the nitro patches and states it has helped a lot. He only has pain when doing things behind his back such as trying to scub is back in the shower. He has no night time pain or pain that wakes him from sleep.  PERTINENT  PMH / PSH: internal GI bleed now well controlled probably 2/2 NSAIDs, family history of CAD  OBJECTIVE:   BP 136/68   Ht 6\' 3"  (1.905 m)   Wt 190 lb (86.2 kg)   BMI 23.75 kg/m   [employer] Adult Exercise 02/07/2020  Frequency of aerobic exercise (# of days/week) 3  Average time in minutes 45  Frequency of strengthening activities (# of days/week) 3    Shoulder, Right: No evidence of bony deformity, asymmetry, or muscle atrophy; No tenderness over long head of biceps (bicipital groove). No TTP at [facility] joint. Full active and passive range of motion except in external rotation, Thumb to T12 without significant tenderness. Strength 5/5 throughout. Sensation intact. Peripheral pulses intact. Special Tests: - Jobe test: NEG   - Hawkins: NEG   - Neer test: NEG   - Belly press test: NEG   ASSESSMENT/PLAN:   Acute bursitis of right shoulder Improved pain and range of motion is doing well.  - Cont nitro patches - Start to wean off tramadol - Adding additional front and lateral raises exercises with light weight - F/u in 6 weeks     Nuala Alpha, DO PGY-4, Sports Medicine Fellow [place]  I observed and examined the patient with the [facility] Fellowt and agree with assessment and plan.  Note reviewed and modified by me. Ila Mcgill, MD

## 2020-04-24 NOTE — Patient Instructions (Signed)
It was great to see you again Charles Hopkins! I'm glad your overall health has improved and that our shoulder is doing better. Today, we discussed cutting down on the Tramadol. Please use it as needed but try to cut down to only 2 per day and if you have minimal to no pain on 2 per day after 1 weeks cut down to 1 per day. Then go to using it only as needed. Continue with the band exercises and the nitro patches for another 4 weeks. Add in the straight arm raise exercises with light weights as we discussed.   We will see you back in 6 weeks but if you are completely better you can call and cancel.  Be well, Harolyn Rutherford, DO PGY-4, Sports Medicine Fellow [place]

## 2020-05-15 ENCOUNTER — Other Ambulatory Visit: Payer: Self-pay | Admitting: Sports Medicine

## 2020-05-15 DIAGNOSIS — M7501 Adhesive capsulitis of right shoulder: Secondary | ICD-10-CM

## 2020-05-15 MED ORDER — TRAMADOL HCL 50 MG PO TABS: 50.0000 mg | ORAL_TABLET | Freq: Four times a day (QID) | ORAL | 2 refills | Status: DC

## 2020-07-18 ENCOUNTER — Other Ambulatory Visit: Payer: Self-pay | Admitting: *Deleted

## 2020-07-18 DIAGNOSIS — M7501 Adhesive capsulitis of right shoulder: Secondary | ICD-10-CM

## 2020-07-18 MED ORDER — GABAPENTIN 300 MG PO CAPS: 300.0000 mg | ORAL_CAPSULE | Freq: Three times a day (TID) | ORAL | 4 refills | Status: DC

## 2020-08-21 ENCOUNTER — Other Ambulatory Visit: Payer: Self-pay | Admitting: Sports Medicine

## 2020-08-21 DIAGNOSIS — M7501 Adhesive capsulitis of right shoulder: Secondary | ICD-10-CM

## 2020-08-21 MED ORDER — TRAMADOL HCL 50 MG PO TABS: 50.0000 mg | ORAL_TABLET | Freq: Four times a day (QID) | ORAL | 2 refills | Status: DC

## 2020-08-21 MED ORDER — GABAPENTIN 300 MG PO CAPS: 300.0000 mg | ORAL_CAPSULE | Freq: Three times a day (TID) | ORAL | 4 refills | Status: DC

## 2020-09-04 ENCOUNTER — Ambulatory Visit: Payer: Medicare Other | Admitting: Sports Medicine

## 2020-12-20 ENCOUNTER — Other Ambulatory Visit: Payer: Self-pay | Admitting: Sports Medicine

## 2020-12-20 DIAGNOSIS — M7501 Adhesive capsulitis of right shoulder: Secondary | ICD-10-CM

## 2020-12-20 MED ORDER — TRAMADOL HCL 50 MG PO TABS: 50.0000 mg | ORAL_TABLET | Freq: Four times a day (QID) | ORAL | 2 refills | Status: DC

## 2021-02-19 ENCOUNTER — Ambulatory Visit (INDEPENDENT_AMBULATORY_CARE_PROVIDER_SITE_OTHER): Payer: Medicare Other | Admitting: Sports Medicine

## 2021-02-19 DIAGNOSIS — Q667 Congenital pes cavus, unspecified foot: Secondary | ICD-10-CM

## 2021-02-19 NOTE — Progress Notes (Signed)
Subjective: Charles Hopkins is a pleasant 70 year-old male who is presenting for orthotics. He was worn custom orthotics for many years and has had excellent  control of midfoot pain. Current orthotics worn so comes for new pair for his running shoes. Has other pair for every day shoes.   Patient was fitted for a : standard, cushioned, semi-rigid orthotic. The orthotic was heated and afterward the patient stood on the orthotic blank positioned on the orthotic stand. The patient was positioned in subtalar neutral position and 10 degrees of ankle dorsiflexion in a weight bearing stance. After completion of molding, a stable base was applied to the orthotic blank. The blank was ground to a stable position for weight bearing. Size: 13 Base: Blue EVA moderate density Posting: none Additional orthotic padding: none   After completion he felt good comfort. Gait was neutral and comfortable. He may follow-up as needed.  Elba Barman, DO PGY-4, Sports Medicine Fellow [place]  I observed and examined the patient with the [facility] resident and agree with assessment and plan.  Note reviewed and modified by me.  Ila Mcgill, MD

## 2021-03-06 ENCOUNTER — Other Ambulatory Visit: Payer: Self-pay | Admitting: Sports Medicine

## 2021-03-06 DIAGNOSIS — M7501 Adhesive capsulitis of right shoulder: Secondary | ICD-10-CM

## 2021-03-06 MED ORDER — TRAMADOL HCL 50 MG PO TABS: 50.0000 mg | ORAL_TABLET | Freq: Four times a day (QID) | ORAL | 2 refills | Status: DC

## 2021-06-29 DEATH — deceased

## 2021-09-16 ENCOUNTER — Other Ambulatory Visit: Payer: Self-pay | Admitting: Sports Medicine

## 2021-09-16 DIAGNOSIS — M7501 Adhesive capsulitis of right shoulder: Secondary | ICD-10-CM

## 2021-09-17 ENCOUNTER — Other Ambulatory Visit: Payer: Self-pay | Admitting: Sports Medicine

## 2021-09-17 DIAGNOSIS — M7501 Adhesive capsulitis of right shoulder: Secondary | ICD-10-CM

## 2021-09-17 MED ORDER — GABAPENTIN 300 MG PO CAPS: 300.0000 mg | ORAL_CAPSULE | Freq: Three times a day (TID) | ORAL | 4 refills | Status: DC

## 2021-09-17 MED ORDER — TRAMADOL HCL 50 MG PO TABS: 50.0000 mg | ORAL_TABLET | Freq: Four times a day (QID) | ORAL | 2 refills | Status: DC

## 2021-09-25 ENCOUNTER — Other Ambulatory Visit: Payer: Self-pay | Admitting: Sports Medicine

## 2021-09-25 DIAGNOSIS — M7501 Adhesive capsulitis of right shoulder: Secondary | ICD-10-CM

## 2021-10-08 ENCOUNTER — Other Ambulatory Visit: Payer: Self-pay

## 2021-10-08 DIAGNOSIS — M7501 Adhesive capsulitis of right shoulder: Secondary | ICD-10-CM

## 2021-10-08 MED ORDER — TRAMADOL HCL 50 MG PO TABS: 50.0000 mg | ORAL_TABLET | Freq: Four times a day (QID) | ORAL | 2 refills | Status: DC

## 2021-10-08 MED ORDER — GABAPENTIN 300 MG PO CAPS: 300.0000 mg | ORAL_CAPSULE | Freq: Three times a day (TID) | ORAL | 4 refills | Status: AC

## 2022-02-18 ENCOUNTER — Other Ambulatory Visit: Payer: Self-pay | Admitting: Sports Medicine

## 2022-02-18 DIAGNOSIS — M7501 Adhesive capsulitis of right shoulder: Secondary | ICD-10-CM

## 2022-02-18 MED ORDER — TRAMADOL HCL 50 MG PO TABS: 50.0000 mg | ORAL_TABLET | Freq: Four times a day (QID) | ORAL | 2 refills | Status: AC

## 2022-02-18 MED ORDER — TRAMADOL HCL 50 MG PO TABS: 50.0000 mg | ORAL_TABLET | Freq: Four times a day (QID) | ORAL | 2 refills | Status: DC

## 2022-02-27 ENCOUNTER — Encounter: Payer: Medicare Other | Admitting: Sports Medicine

## 2022-03-13 ENCOUNTER — Ambulatory Visit: Payer: Medicare Other | Admitting: Sports Medicine

## 2022-03-13 VITALS — BP 132/72 | Ht 75.0 in | Wt 190.0 lb

## 2022-03-13 DIAGNOSIS — Q667 Congenital pes cavus, unspecified foot: Secondary | ICD-10-CM

## 2022-03-13 NOTE — Progress Notes (Signed)
  Charles Hopkins - 71 y.o. male MRN [id number]  Date of birth: [birth year]    CHIEF COMPLAINT:   Orthotics    SUBJECTIVE:   HPI:   Pleasant 71yo M long time clinic patient here for new orthotics. He has worn custom orthotics for many years. Last pair was made in 2022.   Patient was fitted for a : standard, cushioned, semi-rigid orthotic. The orthotic was heated and afterward the patient stood on the orthotic blank positioned on the orthotic stand. The patient was positioned in subtalar neutral position and 10 degrees of ankle dorsiflexion in a weight bearing stance. After completion of molding, a stable base was applied to the orthotic blank. The blank was ground to a stable position for weight bearing. Size: 13 Base: blue EVA Posting: none Additional orthotic padding: none  Gait was neutral with orthotics in place. Patient found them to be comfortable. Follow-up as needed.   Dortha Kern, MD PGY-4, Sports Medicine Fellow [place]  I observed and examined the patient with the resident and agree with assessment and plan.  Note reviewed and modified by me.  Ila Mcgill, MD

## 2022-07-16 ENCOUNTER — Other Ambulatory Visit: Payer: Self-pay | Admitting: Sports Medicine

## 2022-07-16 DIAGNOSIS — M7501 Adhesive capsulitis of right shoulder: Secondary | ICD-10-CM

## 2023-01-21 ENCOUNTER — Ambulatory Visit (INDEPENDENT_AMBULATORY_CARE_PROVIDER_SITE_OTHER): Payer: Medicare HMO | Admitting: Sports Medicine

## 2023-01-21 ENCOUNTER — Encounter: Payer: Self-pay | Admitting: Sports Medicine

## 2023-01-21 VITALS — BP 120/76 | Ht 75.0 in | Wt 195.0 lb

## 2023-01-21 DIAGNOSIS — Q6671 Congenital pes cavus, right foot: Secondary | ICD-10-CM

## 2023-01-21 DIAGNOSIS — Q6672 Congenital pes cavus, left foot: Secondary | ICD-10-CM | POA: Diagnosis not present

## 2023-01-21 NOTE — Progress Notes (Signed)
Patient presenting for orthotic placement.  Patient has significant pes cavus of bilateral feet. Patient notes improvement from previous orthotics.  Patient was fitted for a: Fit and run orthotic. The orthotic was heated, placed on the orthotic stand. The patient was positioned in subtalar neutral position and 10 degrees of ankle dorsiflexion in a weight bearing stance on the heated orthotic blank After completion of molding, a stable base was applied to the orthotic blank. The blank was ground to a stable position for weight bearing. Blank: Fit and Run Base:None Posting:None Size: 13   Face to face time spent was 45 minutes. This time  included evaluation of foot pathology, associated joint issues and gait abnormalities.    Greater than 50% of our face to face time was spent in counseling regarding foot/ ankle /associated joint problems and gait issues, discussion of therapeutic options, measurement and manufacture of custom molded orthotic as detailed above.

## 2023-03-13 ENCOUNTER — Other Ambulatory Visit: Payer: Self-pay | Admitting: Sports Medicine

## 2023-03-13 DIAGNOSIS — M7501 Adhesive capsulitis of right shoulder: Secondary | ICD-10-CM

## 2024-05-19 ENCOUNTER — Encounter: Admitting: Sports Medicine
